# Patient Record
Sex: Female | Born: 1960 | Race: Black or African American | Hispanic: No | Marital: Married | Smoking: Never smoker
Health system: Southern US, Community
[De-identification: ages and names within clinical notes are randomized; demographics above are authoritative.]

## PROBLEM LIST (undated history)

## (undated) DIAGNOSIS — D219 Benign neoplasm of connective and other soft tissue, unspecified: Principal | ICD-10-CM

## (undated) DIAGNOSIS — IMO0002 Reserved for concepts with insufficient information to code with codable children: Secondary | ICD-10-CM

## (undated) DIAGNOSIS — F329 Major depressive disorder, single episode, unspecified: Secondary | ICD-10-CM

## (undated) DIAGNOSIS — R87619 Unspecified abnormal cytological findings in specimens from cervix uteri: Secondary | ICD-10-CM

## (undated) DIAGNOSIS — F419 Anxiety disorder, unspecified: Secondary | ICD-10-CM

## (undated) DIAGNOSIS — F32A Depression, unspecified: Secondary | ICD-10-CM

## (undated) HISTORY — DX: Benign neoplasm of connective and other soft tissue, unspecified: D21.9

## (undated) HISTORY — DX: Unspecified abnormal cytological findings in specimens from cervix uteri: R87.619

## (undated) HISTORY — DX: Reserved for concepts with insufficient information to code with codable children: IMO0002

---
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
## 1983-10-01 HISTORY — PX: OTHER SURGICAL HISTORY: SHX169

## 1991-10-01 HISTORY — PX: DIAGNOSTIC LAPAROSCOPY: SUR761

## 1999-11-27 ENCOUNTER — Other Ambulatory Visit: Admission: RE | Admit: 1999-11-27 | Discharge: 1999-11-27 | Payer: Self-pay | Admitting: Obstetrics and Gynecology

## 2003-07-07 ENCOUNTER — Other Ambulatory Visit: Admission: RE | Admit: 2003-07-07 | Discharge: 2003-07-07 | Payer: Self-pay | Admitting: Family Medicine

## 2003-09-14 ENCOUNTER — Encounter: Admission: RE | Admit: 2003-09-14 | Discharge: 2003-09-14 | Payer: Self-pay | Admitting: Otolaryngology

## 2009-04-15 ENCOUNTER — Emergency Department (HOSPITAL_COMMUNITY): Admission: EM | Admit: 2009-04-15 | Discharge: 2009-04-16 | Payer: Self-pay | Admitting: Emergency Medicine

## 2009-04-24 ENCOUNTER — Emergency Department (HOSPITAL_COMMUNITY): Admission: EM | Admit: 2009-04-24 | Discharge: 2009-04-24 | Payer: Self-pay | Admitting: Emergency Medicine

## 2010-06-22 ENCOUNTER — Ambulatory Visit (HOSPITAL_COMMUNITY): Admission: RE | Admit: 2010-06-22 | Discharge: 2010-06-22 | Payer: Self-pay | Admitting: Family Medicine

## 2010-07-03 ENCOUNTER — Encounter: Admission: RE | Admit: 2010-07-03 | Discharge: 2010-07-03 | Payer: Self-pay | Admitting: Family Medicine
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
## 2010-10-20 ENCOUNTER — Encounter: Payer: Self-pay | Admitting: Family Medicine

## 2011-01-06 LAB — BASIC METABOLIC PANEL
BUN: 7 mg/dL (ref 6–23)
CO2: 22 mEq/L (ref 19–32)
Calcium: 8.5 mg/dL (ref 8.4–10.5)
Chloride: 105 mEq/L (ref 96–112)
Creatinine, Ser: 0.73 mg/dL (ref 0.4–1.2)
GFR calc Af Amer: >60 mL/min (ref >60)
GFR calc non Af Amer: >60 mL/min (ref >60)
Glucose, Bld: 108 mg/dL — ABNORMAL HIGH (ref 70–99)
Potassium: 3.3 mEq/L — ABNORMAL LOW (ref 3.5–5.1)
Sodium: 136 mEq/L (ref 135–145)

## 2011-10-01 DIAGNOSIS — D219 Benign neoplasm of connective and other soft tissue, unspecified: Secondary | ICD-10-CM

## 2011-10-01 HISTORY — DX: Benign neoplasm of connective and other soft tissue, unspecified: D21.9

## 2011-11-24 IMAGING — US US PELVIS COMPLETE MODIFY
1 series · 14 of 25 positions shown · non-contrast
Comparison: None.

06/22/2010 - DUPLICATE COPY for exam association in [facility] – No change from original report.
CLINICAL DATA: Enlarged uterus.

 TRANSABDOMINAL ULTRASOUND OF PELVIS
TECHNIQUE: Transabdominal ultrasound examination of the pelvis was
 performed including evaluation of the uterus, ovaries, adnexal
 regions, and pelvic cul-de-sac.

[Series 1: us pelvis complete modify · 0.28mm/px · 14 of 98 slices shown]
[im 1/98]
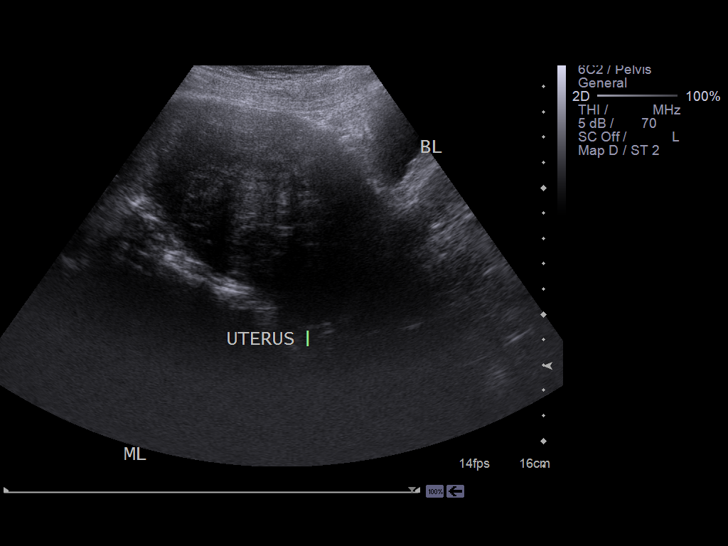
[im 9/98]
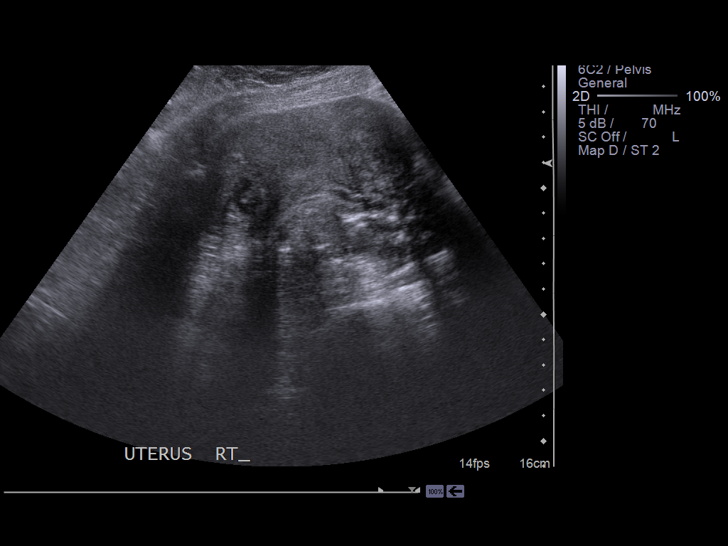
[im 17/98]
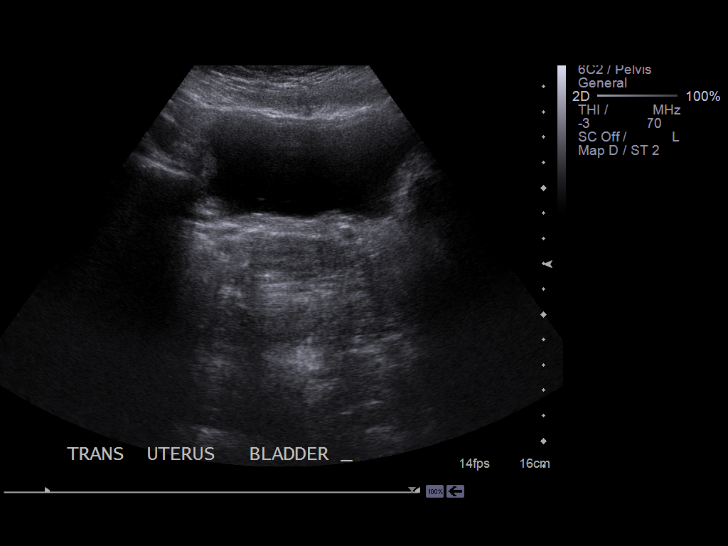
[im 25/98]
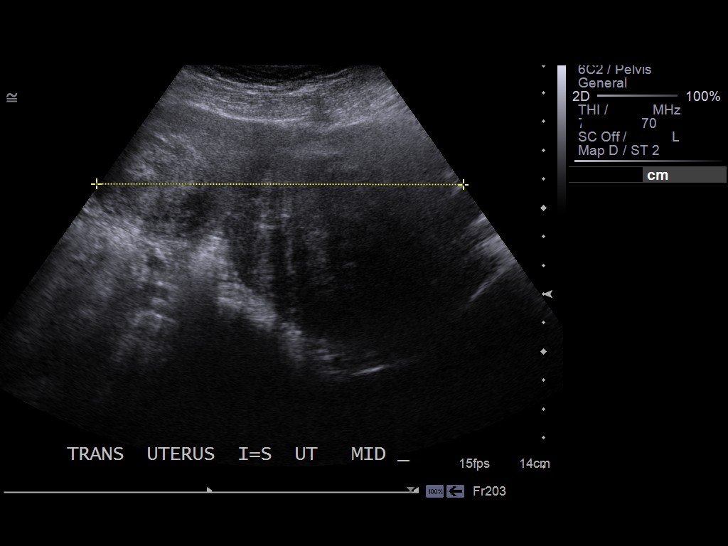
[im 33/98]
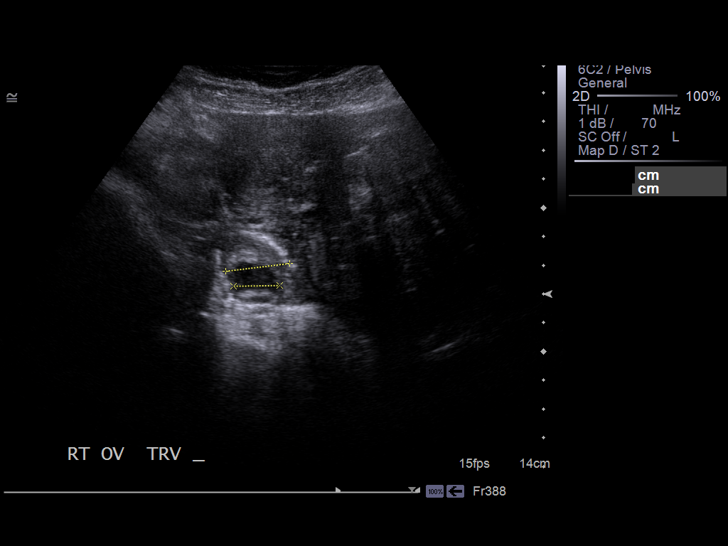
[im 37/98]
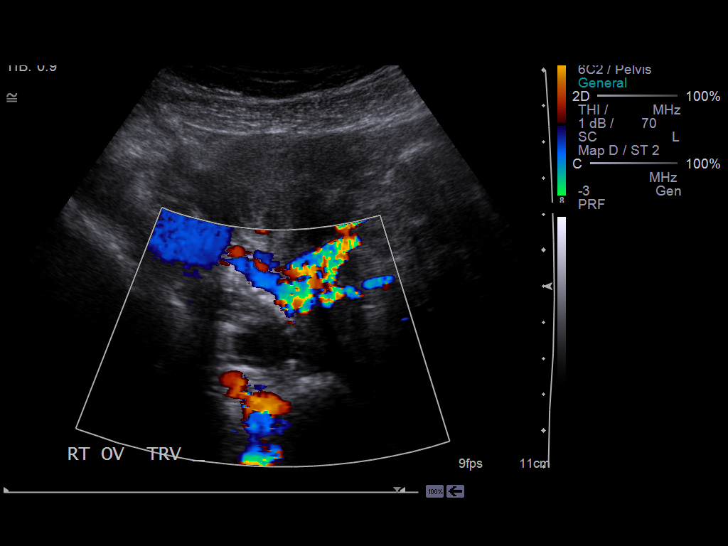
[im 45/98]
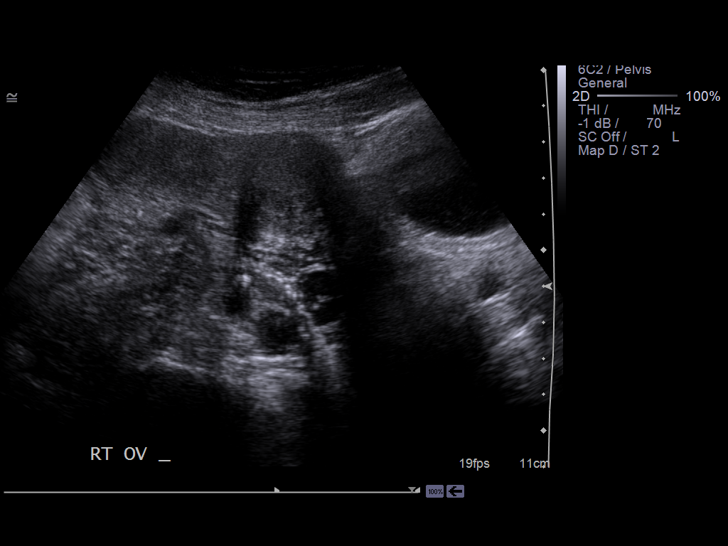
[im 53/98]
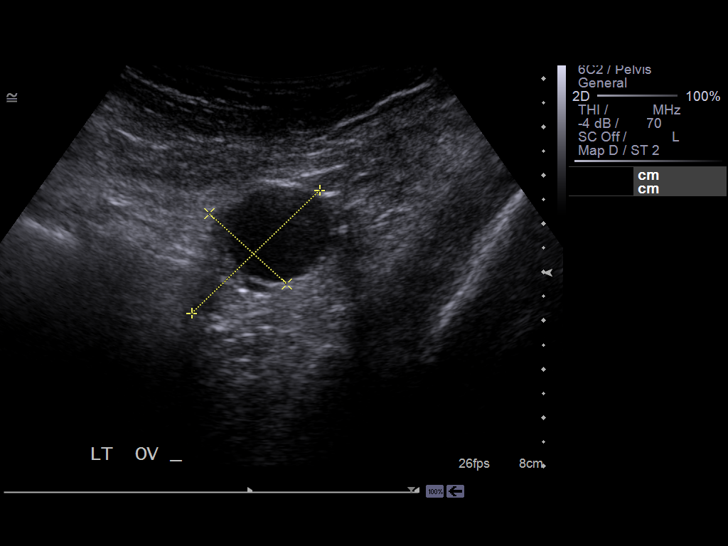
[im 61/98]
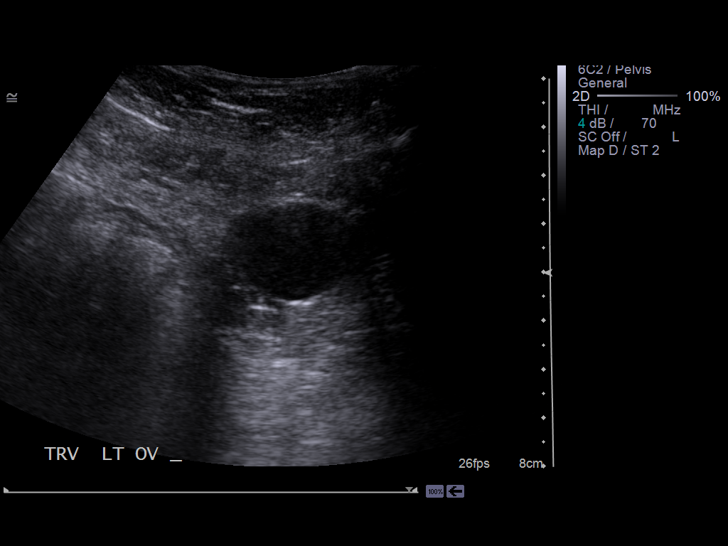
[im 65/98]
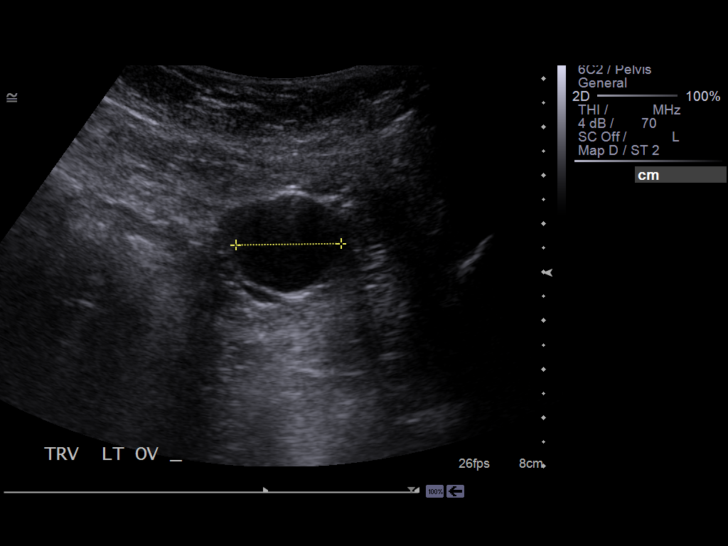
[im 73/98]
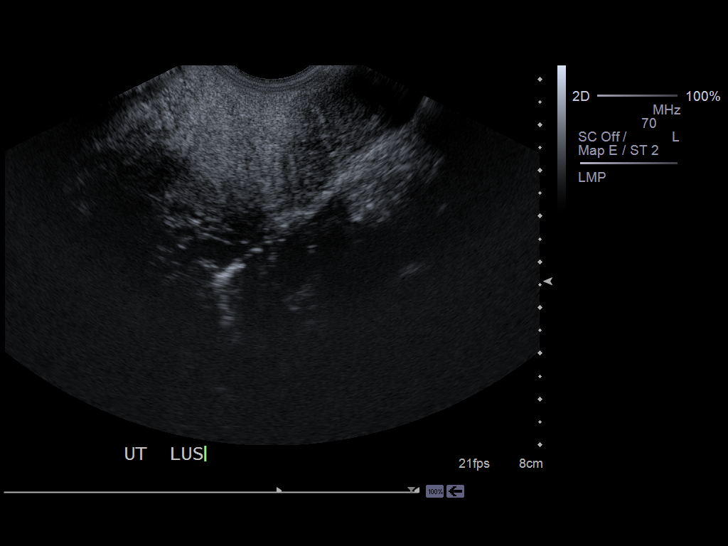
[im 81/98]
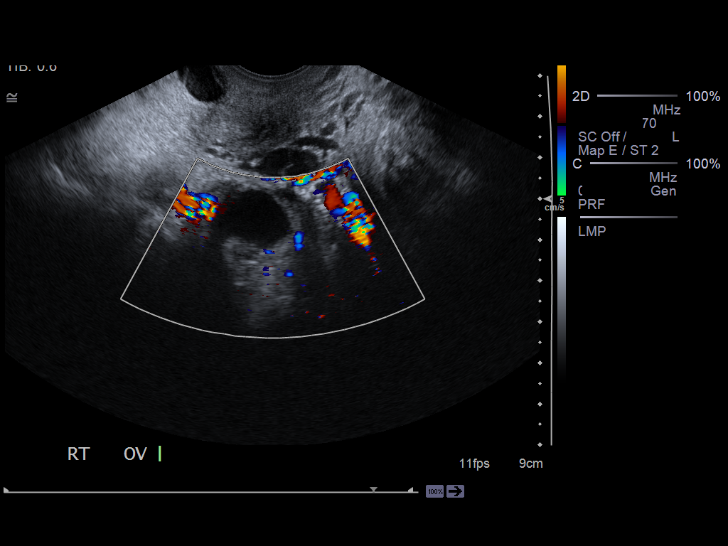
[im 89/98]
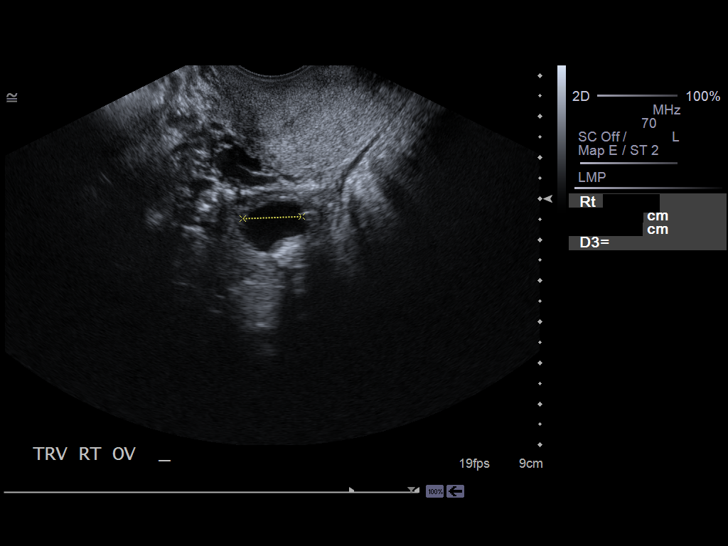
[im 98/98]
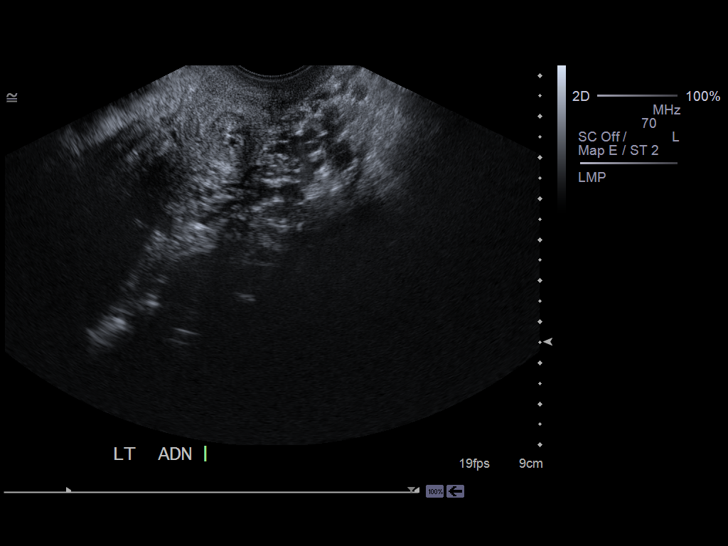

[14 of 25 positions shown; findings below may reference images not displayed]

FINDINGS: Uterus measures 14.8 x 7.0 x 12.7 cm. Large hypoechoic lesions are
 seen within, measuring up to 7.4 x 8.8 x 8.3 cm in the fundus.

 Endometrium is obscured by large fibroids.

 Right Ovary measures 2.9 x 2.1 x 2.2 cm, negative.

 Left Ovary measures 3.7 x 2.2 x 3.0 cm.

 Other Findings: No free fluid. No definite adnexal mass.
IMPRESSION: Large uterine fibroids.

## 2012-04-16 ENCOUNTER — Other Ambulatory Visit: Payer: Self-pay | Admitting: Family Medicine

## 2012-04-16 DIAGNOSIS — Z139 Encounter for screening, unspecified: Secondary | ICD-10-CM

## 2012-05-20 ENCOUNTER — Other Ambulatory Visit: Payer: Self-pay | Admitting: Family Medicine

## 2012-05-20 DIAGNOSIS — R102 Pelvic and perineal pain: Secondary | ICD-10-CM

## 2012-05-22 ENCOUNTER — Ambulatory Visit (HOSPITAL_COMMUNITY)
Admission: RE | Admit: 2012-05-22 | Discharge: 2012-05-22 | Disposition: A | Discharge status: Home / Self Care | Payer: 59 | Source: Ambulatory Visit | Attending: Family Medicine | Admitting: Family Medicine

## 2012-05-22 DIAGNOSIS — R9389 Abnormal findings on diagnostic imaging of other specified body structures: Secondary | ICD-10-CM | POA: Insufficient documentation

## 2012-05-22 DIAGNOSIS — N852 Hypertrophy of uterus: Secondary | ICD-10-CM | POA: Insufficient documentation

## 2012-05-22 DIAGNOSIS — R1031 Right lower quadrant pain: Secondary | ICD-10-CM | POA: Insufficient documentation

## 2012-05-22 DIAGNOSIS — R102 Pelvic and perineal pain: Secondary | ICD-10-CM

## 2012-05-28 ENCOUNTER — Ambulatory Visit (INDEPENDENT_AMBULATORY_CARE_PROVIDER_SITE_OTHER): Payer: 59 | Admitting: Obstetrics and Gynecology

## 2012-05-28 ENCOUNTER — Encounter: Payer: Self-pay | Admitting: Obstetrics and Gynecology

## 2012-05-28 VITALS — BP 100/60 | Ht 64.0 in | Wt 144.0 lb

## 2012-05-28 DIAGNOSIS — D259 Leiomyoma of uterus, unspecified: Secondary | ICD-10-CM

## 2012-05-28 DIAGNOSIS — Z139 Encounter for screening, unspecified: Secondary | ICD-10-CM

## 2012-05-28 DIAGNOSIS — D219 Benign neoplasm of connective and other soft tissue, unspecified: Secondary | ICD-10-CM | POA: Insufficient documentation

## 2012-05-28 DIAGNOSIS — N920 Excessive and frequent menstruation with regular cycle: Secondary | ICD-10-CM

## 2012-05-28 LAB — TSH: TSH: 2.727 u[IU]/mL (ref 0.350–4.500)

## 2012-05-28 LAB — CBC
HCT: 38.6 % (ref 36.0–46.0)
Hemoglobin: 13.1 g/dL (ref 12.0–15.0)
MCH: 27.4 pg (ref 26.0–34.0)
MCHC: 33.9 g/dL (ref 30.0–36.0)
MCV: 80.8 fL (ref 78.0–100.0)
Platelets: 278 10*3/uL (ref 150–400)
RBC: 4.78 MIL/uL (ref 3.87–5.11)
RDW: 14.2 % (ref 11.5–15.5)
WBC: 4.3 10*3/uL (ref 4.0–10.5)

## 2012-05-28 LAB — PROLACTIN: Prolactin: 14.7 ng/mL

## 2012-05-28 NOTE — Progress Notes (Signed)
H/o heavy cycles recenlty and onw which lasted 10 days.  Also c/o pain and says she is in pain right now and wants something done.  She does not have any children and is ok with that and does not desire to pursue that route any longer.  She just wants something done about fibroids.  Reports last pap was abnormal done in August.  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  S/p tx for UTI as well.  Filed Vitals:   05/28/12 0946  BP: 100/60   ROS: noncontributory  Pelvic exam:  VULVA: normal appearing vulva with no masses, tenderness or lesions,  VAGINA: normal appearing vagina with normal color and discharge, no lesions, CERVIX: normal appearing cervix without discharge or lesions,  UTERUS: uterus is 15-16wk size, shape, consistency and positive tenderness, no CMT  ADNEXA: normal adnexa in size, nontender and no masses.   Ultrasound done on May 22, 2012 secondary to right-sided pelvic pain and a history of fibroids showed the uterus to measure 15.6 x 5.1 x 9.9 cm there were multiple fibroids noted and the largest measured 8.5 cm and the next largest measured 3.1 cm the right ovary measured 3.3 cm and the left ovary measured 3.1 cm both appeared to be within normal limits  A/P ROI for last pap - pt says it was abnormal May consider trying lysteda and motrin for next cycle as pt was trying to avoid hysterectomy Sonohyst and em bx same day TSH, PRL, CBC, Vit D

## 2012-05-29 LAB — VITAMIN D 25 HYDROXY (VIT D DEFICIENCY, FRACTURES): Vit D, 25-Hydroxy: 31 ng/mL (ref 30–89)

## 2012-06-19 ENCOUNTER — Other Ambulatory Visit: Payer: Self-pay | Admitting: Obstetrics and Gynecology

## 2012-06-19 ENCOUNTER — Encounter: Payer: Self-pay | Admitting: Obstetrics and Gynecology

## 2012-06-19 ENCOUNTER — Ambulatory Visit (INDEPENDENT_AMBULATORY_CARE_PROVIDER_SITE_OTHER): Payer: 59 | Admitting: Obstetrics and Gynecology

## 2012-06-19 ENCOUNTER — Ambulatory Visit (INDEPENDENT_AMBULATORY_CARE_PROVIDER_SITE_OTHER): Payer: 59

## 2012-06-19 VITALS — BP 120/70 | Resp 16 | Ht 64.0 in | Wt 142.0 lb

## 2012-06-19 DIAGNOSIS — D259 Leiomyoma of uterus, unspecified: Secondary | ICD-10-CM

## 2012-06-19 DIAGNOSIS — D219 Benign neoplasm of connective and other soft tissue, unspecified: Secondary | ICD-10-CM

## 2012-06-19 LAB — POCT URINE PREGNANCY: Preg Test, Ur: NEGATIVE

## 2012-06-19 MED ORDER — IBUPROFEN 600 MG PO TABS: 600.0000 mg | ORAL_TABLET | Freq: Four times a day (QID) | ORAL | Status: DC | PRN

## 2012-06-19 MED ORDER — OXYCODONE HCL 5 MG PO TABS: 5.0000 mg | ORAL_TABLET | ORAL | Status: DC | PRN

## 2012-06-19 NOTE — Progress Notes (Signed)
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  Uterus alone measures 9.2 x 5.3 x 5.9 cm.  Lt ov not seen and rt ovary contains an echogenic focus 8mm.  Filed Vitals:   06/19/12 1612  BP: 120/70  Resp: 16   Reviewed findings  A/P Options and recs discussed Pt wants to proceed with hysterectomy +/- BSO (not yet clear on decision about ovaries) Sched TLH, poss LAVH, poss TAH, cystoscopy +/- BSO I informed pt that secondary to the bulkiness of her uterus and her h/o of not having any children, she may likely need a TAH.  She is ok with that and wants to proceed.

## 2012-06-23 LAB — PATHOLOGY

## 2012-06-25 ENCOUNTER — Telehealth: Payer: Self-pay | Admitting: Obstetrics and Gynecology

## 2012-06-25 ENCOUNTER — Telehealth: Payer: Self-pay

## 2012-06-25 ENCOUNTER — Other Ambulatory Visit: Payer: Self-pay | Admitting: Obstetrics and Gynecology

## 2012-06-25 NOTE — Telephone Encounter (Signed)
TLH BSO; Possible LAVH or TAH; Cystoscopy scheduled for 07/28/12 @ 1:30pm with AR/ND.  [employer] effective 06/13/12 effective 06/13/12.  Plan pays 80/20 after a $1,150 deductible.  Pre-op due $372.61. -ap

## 2012-06-25 NOTE — Telephone Encounter (Signed)
Spoke to pt to get her sched for colpo prior to surgery., per AR. Pt was also notified of EBX results wnl. Melody Comas A

## 2012-07-02 ENCOUNTER — Telehealth: Payer: Self-pay | Admitting: Obstetrics and Gynecology

## 2012-07-02 NOTE — Telephone Encounter (Signed)
Tc to pt per telephone call. Pt request rf for Ibuprofen 600mg  and Oxycodone 5mg . Pt only has a couple pills left of each med.  Pt sched for surgery on 07/28/12 for hysterectomy. Will consult with provider and cb with recs. Pt agrees.

## 2012-07-02 NOTE — Telephone Encounter (Signed)
VM from pt. Requesting RF pain med post op,  PT [id number] or [phone]

## 2012-07-03 ENCOUNTER — Telehealth: Payer: Self-pay

## 2012-07-03 ENCOUNTER — Other Ambulatory Visit: Payer: Self-pay

## 2012-07-03 MED ORDER — IBUPROFEN 600 MG PO TABS: 600.0000 mg | ORAL_TABLET | Freq: Four times a day (QID) | ORAL | Status: DC | PRN

## 2012-07-03 MED ORDER — OXYCODONE HCL 5 MG PO TABS: 5.0000 mg | ORAL_TABLET | ORAL | Status: DC | PRN

## 2012-07-03 NOTE — Telephone Encounter (Signed)
Tc to pt per request for rf for Ibuprofen and Oxycodone. Pt with surg sched 07-28-12 for hyst. Consulted with AR, pt may have both rf's. Rx for Ibuprofen e-pres to pharm on file. Pt will pick up rx for Oxycodone @ next ov on 07/07/12. Pt voices understanding.

## 2012-07-07 ENCOUNTER — Encounter: Payer: Self-pay | Admitting: Obstetrics and Gynecology

## 2012-07-07 ENCOUNTER — Ambulatory Visit (HOSPITAL_COMMUNITY): Payer: Self-pay

## 2012-07-07 ENCOUNTER — Encounter (HOSPITAL_COMMUNITY): Payer: Self-pay

## 2012-07-07 ENCOUNTER — Encounter: Payer: 59 | Admitting: Obstetrics and Gynecology

## 2012-07-07 ENCOUNTER — Encounter (HOSPITAL_COMMUNITY)
Admission: RE | Admit: 2012-07-07 | Discharge: 2012-07-07 | Disposition: A | Discharge status: Home / Self Care | Payer: 59 | Source: Ambulatory Visit | Attending: Obstetrics and Gynecology | Admitting: Obstetrics and Gynecology

## 2012-07-07 ENCOUNTER — Ambulatory Visit (INDEPENDENT_AMBULATORY_CARE_PROVIDER_SITE_OTHER): Payer: 59 | Admitting: Obstetrics and Gynecology

## 2012-07-07 ENCOUNTER — Encounter (HOSPITAL_COMMUNITY): Payer: Self-pay | Admitting: Pharmacy Technician

## 2012-07-07 VITALS — BP 100/62 | Ht 64.0 in | Wt 148.0 lb

## 2012-07-07 DIAGNOSIS — Z01812 Encounter for preprocedural laboratory examination: Secondary | ICD-10-CM | POA: Insufficient documentation

## 2012-07-07 DIAGNOSIS — IMO0002 Reserved for concepts with insufficient information to code with codable children: Secondary | ICD-10-CM | POA: Insufficient documentation

## 2012-07-07 DIAGNOSIS — R6889 Other general symptoms and signs: Secondary | ICD-10-CM

## 2012-07-07 DIAGNOSIS — Z139 Encounter for screening, unspecified: Secondary | ICD-10-CM

## 2012-07-07 DIAGNOSIS — Z01818 Encounter for other preprocedural examination: Secondary | ICD-10-CM | POA: Insufficient documentation

## 2012-07-07 DIAGNOSIS — R87811 Vaginal high risk human papillomavirus (HPV) DNA test positive: Secondary | ICD-10-CM

## 2012-07-07 HISTORY — DX: Depression, unspecified: F32.A

## 2012-07-07 HISTORY — DX: Anxiety disorder, unspecified: F41.9

## 2012-07-07 HISTORY — PX: COLPOSCOPY: SHX161

## 2012-07-07 HISTORY — DX: Major depressive disorder, single episode, unspecified: F32.9

## 2012-07-07 LAB — CBC
HCT: 36.7 % (ref 36.0–46.0)
Hemoglobin: 12.1 g/dL (ref 12.0–15.0)
MCV: 82.8 fL (ref 78.0–100.0)
Platelets: 234 10*3/uL (ref 150–400)
RBC: 4.43 MIL/uL (ref 3.87–5.11)
WBC: 6.3 10*3/uL (ref 4.0–10.5)

## 2012-07-07 LAB — POCT URINE PREGNANCY: Preg Test, Ur: NEGATIVE

## 2012-07-07 NOTE — Patient Instructions (Addendum)
20 TALEYAH HILLMAN  07/07/2012   Your procedure is scheduled on:  07/28/12  Enter through the Main Entrance of [facility] at 12 PM  Pick up the phone at the desk and dial [phone].   Call this number if you have problems the morning of surgery: [phone]   Remember:   Do not eat food:after midnight the evening prior to surgery  Do not drink clear liquids: 4 Hours before arrival.  Take these medicines the morning of surgery with A SIP OF WATER: May take Pristiq and Valium if needed   Do not wear jewelry, make-up or nail polish.  Do not wear lotions, powders, or perfumes. You may wear deodorant.  Do not shave 48 hours prior to surgery.  Do not bring valuables to the hospital.  Contacts, dentures or bridgework may not be worn into surgery.  Leave suitcase in the car. After surgery it may be brought to your room.  For patients admitted to the hospital, checkout time is 11:00 AM the day of discharge.   Patients discharged the day of surgery will not be allowed to drive home.  Name and phone number of your driver: NA  Special Instructions: Shower using CHG 2 nights before surgery and the night before surgery.  If you shower the day of surgery use CHG.  Use special wash - you have one bottle of CHG for all showers.  You should use approximately 1/3 of the bottle for each shower.   Please read over the following fact sheets that you were given: MRSA Information

## 2012-07-07 NOTE — Progress Notes (Signed)
Here for colpo ASCUS with +HPV in Sept  Filed Vitals:   07/07/12 1523  BP: 100/62   Colpo performed TZ visualized No abnl areas noted  Em Bx neg  A/P Keep preop appt  Scheduled for surgery later this month

## 2012-07-08 ENCOUNTER — Encounter: Payer: Self-pay | Admitting: Obstetrics and Gynecology

## 2012-07-08 ENCOUNTER — Ambulatory Visit (INDEPENDENT_AMBULATORY_CARE_PROVIDER_SITE_OTHER): Payer: 59 | Admitting: Obstetrics and Gynecology

## 2012-07-08 VITALS — BP 102/60 | HR 62 | Temp 98.2°F | Resp 14 | Ht 64.0 in | Wt 151.0 lb

## 2012-07-08 DIAGNOSIS — Z01818 Encounter for other preprocedural examination: Secondary | ICD-10-CM

## 2012-07-08 NOTE — Progress Notes (Signed)
Terri Rivera is a 51 y.o. female G1P0010 who presents for hysterectomy because of symptomatic uterine fibroids. Over the past five months, patient has had heavy bleeding with one episode that lasted for ten days requiring a super pad change 3 times a day. Normally she will only bleed for 3 days and change her pad once a day. Additionally, with the heavier bleeding, she will experience cramping rated at 10/10 on a 10 point pain scale that will only decrease to 9/10 with Ibuprofen 600 mg. A pelvic ultrasound with sonohysterogram in Septiember revealed: uterus-10.4 x 10.1 x 7.44 cm with diffuse fibroid enlargement: left lateral subserosal-8.6 x 6.3 x 7.9 cm; anterior midline-2.9 x 2.7 x 2.7 cm; midline fundal-3.3 x 2.9 x 2.7 cm and several lower uterine segment fibroids that appear to have a submucosal component; left ovary appears normal and right ovary has an echogenic 8 mm focus. An endometrial biopsy done at the same time returned normal-no hyperplasia, atypia or malignancy. Lab results were also normal (i.e. TSH, Prolactin and CBC). Patient admits to urinary urgency/hesitancy/frequency and constipation, but denies intermenstrual bleeding, vaginitis symptoms or dyspareunia.   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 ; Last PAP smear: July 2013 showed Atypical Cells of Undetermined Significance and positive high risk HPV but subsequent colposcopy did not reveal any abnormalities.   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 B Complex-Folic Acid (B COMPLEX PLUS PO) Take 1,000 mcg by mouth every morning.  . desvenlafaxine (PRISTIQ) 50 MG 24 hr tablet Take 50 mg by mouth daily.  . diazepam (VALIUM) 10 MG tablet Take 10 mg by mouth every 8 (eight) hours as needed. For anxiety  . ibuprofen (ADVIL,MOTRIN) 600 MG tablet Take 1 tablet (600 mg total) by mouth every 6 (six) hours as needed for pain. 30 tablet 0  . oxyCODONE (OXY IR/ROXICODONE) 5 MG immediate release tablet Take 1 tablet (5 mg total) by mouth every 4 (four) hours as needed for pain. 30 tablet 0  . zolpidem (AMBIEN) 10 MG tablet Take 10 mg by mouth at bedtime as needed. For sleep, only takes while working   Allergies  Allergen Reactions  . Codeine Hives  . Tylenol (Acetaminophen) Itching   Denies sensitivity to peanuts, shellfish, soy, latex or adhesives.   ROS: Admits to partial upper dental plate but denies headache, vision changes, nasal congestion, dysphagia, tinnitus, dizziness, hoarseness, cough, chest pain, shortness of breath, nausea, vomiting, diarrhea, dysuria, hematuria, vaginitis symptoms, pelvic pain, swelling of joints,easy bruising, myalgias, arthralgias, skin rashes, unexplained weight loss and except as is mentioned in the history of present illness, patient's review of systems is otherwise negative.    Physical Exam  BP 102/60  Pulse 62  Temp 98.2 F (36.8 C) (Oral)  Resp 14  Ht 5' 4" (1.626 m)  Wt 151 lb (68.493 kg)  BMI 25.92 kg/m2  LMP 06/16/2012  Neck: supple without masses or thyromegaly  Lungs: clear to auscultation  Heart: regular rate and rhythm  Abdomen: soft, non-tender and   no organomegaly  Pelvic:EGBUS- wnl; vagina-normal rugae; uterus-irregular, tender, 14-16 weeks size; cervix without lesions or motion tenderness; adnexae-no tenderness or separable masses though obliterated by the mass effect of the uterus  Extremities: no clubbing, cyanosis  or edema    Assesment: Symptomatic Uterine Fibroids                       Menorrhagia                       Pelvic Pain    Disposition: A discussion was held with patient regarding the indication for her procedure(s) along with the risks, which include but are not limited to: reaction to anesthesia, damage to adjacent organs, infection, pelvic prolapse and excessive bleeding. Patient verbalized understanding of these risks and has consented to proceed with a Total Laparoscopic Hysterectomy with possible, Laparoscopically Assisted Vaginal Hysterectomy, possible Total Abdominal Hysterectomy with Bilateral Salpingo-oophorectomy and possible Cystoscopy at [facility], July 28, 2012 at 1:30 p.m.   CSN# [id number]  Ellard Nan J. Sky Borboa, PA-C for Dr. Angela Y. Roberts  

## 2012-07-09 ENCOUNTER — Ambulatory Visit (HOSPITAL_COMMUNITY): Payer: 59

## 2012-07-09 ENCOUNTER — Other Ambulatory Visit (HOSPITAL_COMMUNITY): Payer: 59

## 2012-07-13 NOTE — H&P (Signed)
Terri Rivera is a 50 y.o. female G1P0010 who presents for hysterectomy because of symptomatic uterine fibroids. Over the past five months, patient has had heavy bleeding with one episode that lasted for ten days requiring a super pad change 3 times a day. Normally she will only bleed for 3 days and change her pad once a day. Additionally, with the heavier bleeding, she will experience cramping rated at 10/10 on a 10 point pain scale that will only decrease to 9/10 with Ibuprofen 600 mg. A pelvic ultrasound with sonohysterogram in Septiember revealed: uterus-10.4 x 10.1 x 7.44 cm with diffuse fibroid enlargement: left lateral subserosal-8.6 x 6.3 x 7.9 cm; anterior midline-2.9 x 2.7 x 2.7 cm; midline fundal-3.3 x 2.9 x 2.7 cm and several lower uterine segment fibroids that appear to have a submucosal component; left ovary appears normal and right ovary has an echogenic 8 mm focus. An endometrial biopsy done at the same time returned normal-no hyperplasia, atypia or malignancy. Lab results were also normal (i.e. TSH, Prolactin and CBC). Patient admits to urinary urgency/hesitancy/frequency and constipation, but denies intermenstrual bleeding, vaginitis symptoms or dyspareunia.   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 ; Last PAP smear: July 2013 showed Atypical Cells of Undetermined Significance and positive high risk HPV but subsequent colposcopy did not reveal any abnormalities.   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 B Complex-Folic Acid (B COMPLEX PLUS PO) Take 1,000 mcg by mouth every morning.  . desvenlafaxine (PRISTIQ) 50 MG 24 hr tablet Take 50 mg by mouth daily.  . diazepam (VALIUM) 10 MG tablet Take 10 mg by mouth every 8 (eight) hours as needed. For anxiety  . ibuprofen (ADVIL,MOTRIN) 600 MG tablet Take 1 tablet (600 mg total) by mouth every 6 (six) hours as needed for pain. 30 tablet 0  . oxyCODONE (OXY IR/ROXICODONE) 5 MG immediate release tablet Take 1 tablet (5 mg total) by mouth every 4 (four) hours as needed for pain. 30 tablet 0  . zolpidem (AMBIEN) 10 MG tablet Take 10 mg by mouth at bedtime as needed. For sleep, only takes while working   Allergies  Allergen Reactions  . Codeine Hives  . Tylenol (Acetaminophen) Itching   Denies sensitivity to peanuts, shellfish, soy, latex or adhesives.   ROS: Admits to partial upper dental plate but denies headache, vision changes, nasal congestion, dysphagia, tinnitus, dizziness, hoarseness, cough, chest pain, shortness of breath, nausea, vomiting, diarrhea, dysuria, hematuria, vaginitis symptoms, pelvic pain, swelling of joints,easy bruising, myalgias, arthralgias, skin rashes, unexplained weight loss and except as is mentioned in the history of present illness, patient's review of systems is otherwise negative.    Physical Exam  BP 102/60  Pulse 62  Temp 98.2 F (36.8 C) (Oral)  Resp 14  Ht 5' 4" (1.626 m)  Wt 151 lb (68.493 kg)  BMI 25.92 kg/m2  LMP 06/16/2012  Neck: supple without masses or thyromegaly  Lungs: clear to auscultation  Heart: regular rate and rhythm  Abdomen: soft, non-tender and   no organomegaly  Pelvic:EGBUS- wnl; vagina-normal rugae; uterus-irregular, tender, 14-16 weeks size; cervix without lesions or motion tenderness; adnexae-no tenderness or separable masses though obliterated by the mass effect of the uterus  Extremities: no clubbing, cyanosis  or edema    Assesment: Symptomatic Uterine Fibroids                       Menorrhagia                       Pelvic Pain    Disposition: A discussion was held with patient regarding the indication for her procedure(s) along with the risks, which include but are not limited to: reaction to anesthesia, damage to adjacent organs, infection, pelvic prolapse and excessive bleeding. Patient verbalized understanding of these risks and has consented to proceed with a Total Laparoscopic Hysterectomy with possible, Laparoscopically Assisted Vaginal Hysterectomy, possible Total Abdominal Hysterectomy with Bilateral Salpingo-oophorectomy and possible Cystoscopy at [facility], July 28, 2012 at 1:30 p.m.   CSN# [id number]  Trayonna Bachmeier J. Kaye Luoma, PA-C for Dr. Angela Y. Roberts  

## 2012-07-14 ENCOUNTER — Inpatient Hospital Stay (HOSPITAL_COMMUNITY): Admission: RE | Admit: 2012-07-14 | Payer: 59 | Source: Ambulatory Visit

## 2012-07-15 ENCOUNTER — Encounter: Payer: 59 | Admitting: Obstetrics and Gynecology

## 2012-07-27 MED ORDER — DEXTROSE 5 % IV SOLN
2.0000 g | INTRAVENOUS | Status: AC
  Administered 2012-07-28 (×2): 2 g via INTRAVENOUS
  Filled 2012-07-27: qty 2

## 2012-07-28 ENCOUNTER — Inpatient Hospital Stay (HOSPITAL_COMMUNITY)
Admission: RE | Admit: 2012-07-28 | Discharge: 2012-07-30 | DRG: 743 | Disposition: A | Discharge status: Home / Self Care | Payer: 59 | Source: Ambulatory Visit | Attending: Obstetrics and Gynecology | Admitting: Obstetrics and Gynecology

## 2012-07-28 ENCOUNTER — Encounter (HOSPITAL_COMMUNITY)
Admission: RE | Disposition: A | Discharge status: Home / Self Care | Payer: Self-pay | Source: Ambulatory Visit | Attending: Obstetrics and Gynecology

## 2012-07-28 ENCOUNTER — Encounter (HOSPITAL_COMMUNITY): Payer: Self-pay | Admitting: Anesthesiology

## 2012-07-28 ENCOUNTER — Ambulatory Visit (HOSPITAL_COMMUNITY): Payer: 59 | Admitting: Anesthesiology

## 2012-07-28 DIAGNOSIS — N8000 Endometriosis of the uterus, unspecified: Secondary | ICD-10-CM | POA: Diagnosis present

## 2012-07-28 DIAGNOSIS — Z5331 Laparoscopic surgical procedure converted to open procedure: Secondary | ICD-10-CM

## 2012-07-28 DIAGNOSIS — N831 Corpus luteum cyst of ovary, unspecified side: Secondary | ICD-10-CM | POA: Diagnosis present

## 2012-07-28 DIAGNOSIS — D251 Intramural leiomyoma of uterus: Secondary | ICD-10-CM | POA: Diagnosis present

## 2012-07-28 DIAGNOSIS — N920 Excessive and frequent menstruation with regular cycle: Secondary | ICD-10-CM | POA: Diagnosis present

## 2012-07-28 DIAGNOSIS — D250 Submucous leiomyoma of uterus: Principal | ICD-10-CM | POA: Diagnosis present

## 2012-07-28 DIAGNOSIS — N949 Unspecified condition associated with female genital organs and menstrual cycle: Secondary | ICD-10-CM | POA: Diagnosis present

## 2012-07-28 DIAGNOSIS — D649 Anemia, unspecified: Secondary | ICD-10-CM | POA: Diagnosis present

## 2012-07-28 DIAGNOSIS — D259 Leiomyoma of uterus, unspecified: Secondary | ICD-10-CM

## 2012-07-28 DIAGNOSIS — Z9889 Other specified postprocedural states: Secondary | ICD-10-CM

## 2012-07-28 DIAGNOSIS — D279 Benign neoplasm of unspecified ovary: Secondary | ICD-10-CM | POA: Diagnosis present

## 2012-07-28 DIAGNOSIS — D252 Subserosal leiomyoma of uterus: Secondary | ICD-10-CM | POA: Diagnosis present

## 2012-07-28 HISTORY — PX: ABDOMINAL HYSTERECTOMY: SHX81

## 2012-07-28 HISTORY — PX: SALPINGOOPHORECTOMY: SHX82

## 2012-07-28 HISTORY — PX: LAPAROSCOPY: SHX197

## 2012-07-28 HISTORY — PX: CYSTOSCOPY: SHX5120

## 2012-07-28 LAB — HCG, SERUM, QUALITATIVE: Preg, Serum: NEGATIVE

## 2012-07-28 SURGERY — HYSTERECTOMY, ABDOMINAL
Anesthesia: General | Site: Abdomen | Wound class: Clean Contaminated

## 2012-07-28 MED ORDER — ONDANSETRON HCL 4 MG/2ML IJ SOLN: 4.0000 mg | Freq: Four times a day (QID) | INTRAMUSCULAR | Status: DC | PRN

## 2012-07-28 MED ORDER — FENTANYL CITRATE 0.05 MG/ML IJ SOLN
INTRAMUSCULAR | Status: DC | PRN
  Administered 2012-07-28: 100 ug via INTRAVENOUS
  Administered 2012-07-28: 50 ug via INTRAVENOUS
  Administered 2012-07-28 (×2): 100 ug via INTRAVENOUS
  Administered 2012-07-28: 50 ug via INTRAVENOUS
  Administered 2012-07-28: 100 ug via INTRAVENOUS

## 2012-07-28 MED ORDER — KETOROLAC TROMETHAMINE 30 MG/ML IJ SOLN
30.0000 mg | Freq: Four times a day (QID) | INTRAMUSCULAR | Status: DC
  Administered 2012-07-28: 30 mg via INTRAVENOUS

## 2012-07-28 MED ORDER — DEXAMETHASONE SODIUM PHOSPHATE 10 MG/ML IJ SOLN
INTRAMUSCULAR | Status: DC | PRN
  Administered 2012-07-28: 10 mg via INTRAVENOUS

## 2012-07-28 MED ORDER — OXYCODONE-ACETAMINOPHEN 5-325 MG PO TABS: 1.0000 | ORAL_TABLET | ORAL | Status: DC | PRN

## 2012-07-28 MED ORDER — HYDROMORPHONE HCL PF 1 MG/ML IJ SOLN
INTRAMUSCULAR | Status: AC
  Administered 2012-07-28: 0.5 mg via INTRAVENOUS
  Filled 2012-07-28: qty 1

## 2012-07-28 MED ORDER — ESTRADIOL 0.1 MG/GM VA CREA
TOPICAL_CREAM | VAGINAL | Status: AC
  Filled 2012-07-28: qty 42.5

## 2012-07-28 MED ORDER — GLYCOPYRROLATE 0.2 MG/ML IJ SOLN
INTRAMUSCULAR | Status: DC | PRN
  Administered 2012-07-28: 0.1 mg via INTRAVENOUS
  Administered 2012-07-28: 0.6 mg via INTRAVENOUS
  Administered 2012-07-28: 0.1 mg via INTRAVENOUS
  Administered 2012-07-28: 0.4 mg via INTRAVENOUS

## 2012-07-28 MED ORDER — METOCLOPRAMIDE HCL 5 MG/ML IJ SOLN
10.0000 mg | Freq: Three times a day (TID) | INTRAMUSCULAR | Status: AC
  Administered 2012-07-29 (×2): 10 mg via INTRAVENOUS
  Filled 2012-07-28 (×3): qty 2

## 2012-07-28 MED ORDER — DIPHENHYDRAMINE HCL 50 MG/ML IJ SOLN: 12.5000 mg | Freq: Three times a day (TID) | INTRAMUSCULAR | Status: DC | PRN

## 2012-07-28 MED ORDER — DEXTROSE 5 % IV SOLN: 2.0000 g | Freq: Two times a day (BID) | INTRAVENOUS | Status: DC

## 2012-07-28 MED ORDER — ROCURONIUM BROMIDE 50 MG/5ML IV SOLN
INTRAVENOUS | Status: AC
  Filled 2012-07-28: qty 1

## 2012-07-28 MED ORDER — NALOXONE HCL 0.4 MG/ML IJ SOLN
INTRAMUSCULAR | Status: AC
  Filled 2012-07-28: qty 1

## 2012-07-28 MED ORDER — KETOROLAC TROMETHAMINE 30 MG/ML IJ SOLN
INTRAMUSCULAR | Status: AC
  Administered 2012-07-28: 30 mg via INTRAVENOUS
  Filled 2012-07-28: qty 1

## 2012-07-28 MED ORDER — HYDROMORPHONE HCL PF 1 MG/ML IJ SOLN
INTRAMUSCULAR | Status: DC | PRN
  Administered 2012-07-28: 1 mg via INTRAVENOUS

## 2012-07-28 MED ORDER — INDIGOTINDISULFONATE SODIUM 8 MG/ML IJ SOLN
INTRAMUSCULAR | Status: DC | PRN
  Administered 2012-07-28: 5 mL via INTRAVENOUS

## 2012-07-28 MED ORDER — DEXAMETHASONE SODIUM PHOSPHATE 10 MG/ML IJ SOLN
INTRAMUSCULAR | Status: AC
  Filled 2012-07-28: qty 1

## 2012-07-28 MED ORDER — KETOROLAC TROMETHAMINE 30 MG/ML IJ SOLN
30.0000 mg | Freq: Four times a day (QID) | INTRAMUSCULAR | Status: AC
  Administered 2012-07-29: 30 mg via INTRAVENOUS
  Filled 2012-07-28 (×2): qty 1

## 2012-07-28 MED ORDER — GLYCOPYRROLATE 0.2 MG/ML IJ SOLN
INTRAMUSCULAR | Status: AC
  Filled 2012-07-28: qty 3

## 2012-07-28 MED ORDER — LACTATED RINGERS IV SOLN
INTRAVENOUS | Status: DC
  Administered 2012-07-29: 02:00:00 via INTRAVENOUS

## 2012-07-28 MED ORDER — NEOSTIGMINE METHYLSULFATE 1 MG/ML IJ SOLN
INTRAMUSCULAR | Status: AC
  Filled 2012-07-28: qty 10

## 2012-07-28 MED ORDER — MIDAZOLAM HCL 2 MG/2ML IJ SOLN
INTRAMUSCULAR | Status: AC
  Filled 2012-07-28: qty 2

## 2012-07-28 MED ORDER — VASOPRESSIN 20 UNIT/ML IJ SOLN
INTRAMUSCULAR | Status: AC
  Filled 2012-07-28: qty 1

## 2012-07-28 MED ORDER — BUPIVACAINE HCL (PF) 0.25 % IJ SOLN
INTRAMUSCULAR | Status: DC | PRN
  Administered 2012-07-28: 7 mL

## 2012-07-28 MED ORDER — BUPIVACAINE HCL (PF) 0.25 % IJ SOLN
INTRAMUSCULAR | Status: AC
  Filled 2012-07-28: qty 30

## 2012-07-28 MED ORDER — DIPHENHYDRAMINE HCL 12.5 MG/5ML PO ELIX
12.5000 mg | ORAL_SOLUTION | Freq: Four times a day (QID) | ORAL | Status: DC | PRN
  Administered 2012-07-30: 12.5 mg via ORAL
  Filled 2012-07-28: qty 5

## 2012-07-28 MED ORDER — DEXTROSE 5 % IV SOLN
2.0000 g | Freq: Once | INTRAVENOUS | Status: DC
  Filled 2012-07-28: qty 2

## 2012-07-28 MED ORDER — HYDROMORPHONE 0.3 MG/ML IV SOLN: INTRAVENOUS | Status: DC

## 2012-07-28 MED ORDER — SODIUM CHLORIDE 0.9 % IJ SOLN: 9.0000 mL | INTRAMUSCULAR | Status: DC | PRN

## 2012-07-28 MED ORDER — LIDOCAINE HCL (CARDIAC) 20 MG/ML IV SOLN
INTRAVENOUS | Status: DC | PRN
  Administered 2012-07-28: 60 mg via INTRAVENOUS

## 2012-07-28 MED ORDER — DIPHENHYDRAMINE HCL 50 MG/ML IJ SOLN: 12.5000 mg | Freq: Four times a day (QID) | INTRAMUSCULAR | Status: DC | PRN

## 2012-07-28 MED ORDER — IBUPROFEN 600 MG PO TABS
600.0000 mg | ORAL_TABLET | Freq: Four times a day (QID) | ORAL | Status: DC | PRN
  Administered 2012-07-29 – 2012-07-30 (×4): 600 mg via ORAL
  Filled 2012-07-28 (×4): qty 1

## 2012-07-28 MED ORDER — STERILE WATER FOR IRRIGATION IR SOLN
Status: DC | PRN
  Administered 2012-07-28: 1000 mL

## 2012-07-28 MED ORDER — VASOPRESSIN 20 UNIT/ML IJ SOLN
INTRAVENOUS | Status: DC | PRN
  Administered 2012-07-28: 16:00:00 via INTRAMUSCULAR

## 2012-07-28 MED ORDER — NALOXONE HCL 0.4 MG/ML IJ SOLN
0.4000 mg | INTRAMUSCULAR | Status: DC | PRN
  Administered 2012-07-28: 0.4 mg via INTRAVENOUS

## 2012-07-28 MED ORDER — ROCURONIUM BROMIDE 100 MG/10ML IV SOLN
INTRAVENOUS | Status: DC | PRN
  Administered 2012-07-28: 10 mg via INTRAVENOUS
  Administered 2012-07-28: 25 mg via INTRAVENOUS
  Administered 2012-07-28: 20 mg via INTRAVENOUS
  Administered 2012-07-28: 5 mg via INTRAVENOUS
  Administered 2012-07-28: 20 mg via INTRAVENOUS

## 2012-07-28 MED ORDER — HYDROMORPHONE HCL PF 1 MG/ML IJ SOLN
INTRAMUSCULAR | Status: AC
  Filled 2012-07-28: qty 1

## 2012-07-28 MED ORDER — FENTANYL CITRATE 0.05 MG/ML IJ SOLN
INTRAMUSCULAR | Status: AC
  Filled 2012-07-28: qty 5

## 2012-07-28 MED ORDER — NEOSTIGMINE METHYLSULFATE 1 MG/ML IJ SOLN
INTRAMUSCULAR | Status: DC | PRN
  Administered 2012-07-28: 3 mg via INTRAVENOUS
  Administered 2012-07-28: 2 mg via INTRAVENOUS

## 2012-07-28 MED ORDER — PROPOFOL 10 MG/ML IV BOLUS
INTRAVENOUS | Status: DC | PRN
  Administered 2012-07-28: 160 mg via INTRAVENOUS

## 2012-07-28 MED ORDER — PROPOFOL 10 MG/ML IV EMUL
INTRAVENOUS | Status: AC
  Filled 2012-07-28: qty 20

## 2012-07-28 MED ORDER — INDIGOTINDISULFONATE SODIUM 8 MG/ML IJ SOLN
INTRAMUSCULAR | Status: AC
  Filled 2012-07-28: qty 5

## 2012-07-28 MED ORDER — MIDAZOLAM HCL 5 MG/5ML IJ SOLN
INTRAMUSCULAR | Status: DC | PRN
  Administered 2012-07-28: 2 mg via INTRAVENOUS

## 2012-07-28 MED ORDER — HYDROMORPHONE HCL 2 MG PO TABS
2.0000 mg | ORAL_TABLET | ORAL | Status: DC | PRN
  Administered 2012-07-28 – 2012-07-29 (×4): 2 mg via ORAL
  Filled 2012-07-28 (×4): qty 1

## 2012-07-28 MED ORDER — ONDANSETRON HCL 4 MG/2ML IJ SOLN
INTRAMUSCULAR | Status: DC | PRN
  Administered 2012-07-28: 4 mg via INTRAVENOUS

## 2012-07-28 MED ORDER — HYDROMORPHONE 0.3 MG/ML IV SOLN
INTRAVENOUS | Status: DC
  Filled 2012-07-28: qty 25

## 2012-07-28 MED ORDER — HYDROMORPHONE HCL PF 1 MG/ML IJ SOLN
0.2500 mg | INTRAMUSCULAR | Status: DC | PRN
  Administered 2012-07-28 (×4): 0.5 mg via INTRAVENOUS

## 2012-07-28 MED ORDER — LACTATED RINGERS IV SOLN
INTRAVENOUS | Status: DC | PRN
  Administered 2012-07-28 (×3): via INTRAVENOUS
  Administered 2012-07-28: 1000 mL
  Administered 2012-07-28: 14:00:00 via INTRAVENOUS

## 2012-07-28 MED ORDER — LIDOCAINE HCL (CARDIAC) 20 MG/ML IV SOLN
INTRAVENOUS | Status: AC
  Filled 2012-07-28: qty 5

## 2012-07-28 MED ORDER — 0.9 % SODIUM CHLORIDE (POUR BTL) OPTIME
TOPICAL | Status: DC | PRN
  Administered 2012-07-28: 1000 mL

## 2012-07-28 MED ORDER — ONDANSETRON HCL 4 MG/2ML IJ SOLN
INTRAMUSCULAR | Status: AC
  Filled 2012-07-28: qty 2

## 2012-07-28 SURGICAL SUPPLY — 91 items
BARRIER ADHS 3X4 INTERCEED (GAUZE/BANDAGES/DRESSINGS) IMPLANT
CABLE HIGH FREQUENCY MONO STRZ (ELECTRODE) IMPLANT
CANISTER SUCTION 2500CC (MISCELLANEOUS) ×4 IMPLANT
CATH ROBINSON RED A/P 16FR (CATHETERS) IMPLANT
CHLORAPREP W/TINT 26ML (MISCELLANEOUS) ×4 IMPLANT
CLEANER TIP ELECTROSURG 2X2 (MISCELLANEOUS) ×4 IMPLANT
CLIP LIGATING EXTRA MED SLVR (CLIP) ×4 IMPLANT
CLOTH BEACON ORANGE TIMEOUT ST (SAFETY) ×4 IMPLANT
CONT PATH 16OZ SNAP LID 3702 (MISCELLANEOUS) ×4 IMPLANT
COVER LIGHT HANDLE  1/PK (MISCELLANEOUS) ×1
COVER LIGHT HANDLE 1/PK (MISCELLANEOUS) ×3 IMPLANT
COVER MAYO STAND STRL (DRAPES) ×4 IMPLANT
COVER TABLE BACK 60X90 (DRAPES) ×4 IMPLANT
DECANTER SPIKE VIAL GLASS SM (MISCELLANEOUS) ×4 IMPLANT
DERMABOND ADVANCED (GAUZE/BANDAGES/DRESSINGS) ×1
DERMABOND ADVANCED .7 DNX12 (GAUZE/BANDAGES/DRESSINGS) ×3 IMPLANT
DISSECTOR BLUNT TIP ENDO 5MM (MISCELLANEOUS) IMPLANT
DISSECTOR SPONGE CHERRY (GAUZE/BANDAGES/DRESSINGS) IMPLANT
DRAIN JACKSON PRT FLT 7MM (DRAIN) ×4 IMPLANT
DRAPE HYSTEROSCOPY (DRAPE) ×4 IMPLANT
DRAPE PROXIMA HALF (DRAPES) ×4 IMPLANT
ELECT REM PT RETURN 9FT ADLT (ELECTROSURGICAL) ×4
ELECTRODE REM PT RTRN 9FT ADLT (ELECTROSURGICAL) ×3 IMPLANT
EVACUATOR SILICONE 100CC (DRAIN) ×4 IMPLANT
EVACUATOR SMOKE 8.L (FILTER) ×8 IMPLANT
FORCEPS CUTTING 33CM 5MM (CUTTING FORCEPS) IMPLANT
GAUZE PACKING 2X5 YD STERILE (GAUZE/BANDAGES/DRESSINGS) IMPLANT
GAUZE SPONGE 4X4 16PLY XRAY LF (GAUZE/BANDAGES/DRESSINGS) ×8 IMPLANT
GLOVE BIO SURGEON STRL SZ7.5 (GLOVE) ×24 IMPLANT
GLOVE BIOGEL PI IND STRL 7.5 (GLOVE) ×6 IMPLANT
GLOVE BIOGEL PI INDICATOR 7.5 (GLOVE) ×2
GOWN PREVENTION PLUS LG XLONG (DISPOSABLE) ×12 IMPLANT
HEMOSTAT SURGICEL 2X14 (HEMOSTASIS) IMPLANT
HEMOSTAT SURGICEL 2X3 (HEMOSTASIS) ×8 IMPLANT
HEMOSTAT SURGICEL 4X8 (HEMOSTASIS) ×4 IMPLANT
NEEDLE HYPO 25X1 1.5 SAFETY (NEEDLE) IMPLANT
NEEDLE INSUFFLATION 14GA 120MM (NEEDLE) ×4 IMPLANT
NEEDLE MAYO .5 CIRCLE (NEEDLE) IMPLANT
NS IRRIG 1000ML POUR BTL (IV SOLUTION) IMPLANT
OCCLUDER COLPOPNEUMO (BALLOONS) ×4 IMPLANT
PACK LAPAROSCOPY BASIN (CUSTOM PROCEDURE TRAY) ×4 IMPLANT
PACK LAVH (CUSTOM PROCEDURE TRAY) IMPLANT
PAD OB MATERNITY 4.3X12.25 (PERSONAL CARE ITEMS) ×4 IMPLANT
PENCIL BUTTON HOLSTER BLD 10FT (ELECTRODE) ×4 IMPLANT
PROTECTOR NERVE ULNAR (MISCELLANEOUS) ×12 IMPLANT
SCALPEL HARMONIC ACE (MISCELLANEOUS) IMPLANT
SCISSORS LAP 5X35 DISP (ENDOMECHANICALS) IMPLANT
SET CYSTO W/LG BORE CLAMP LF (SET/KITS/TRAYS/PACK) ×4 IMPLANT
SET IRRIG TUBING LAPAROSCOPIC (IRRIGATION / IRRIGATOR) ×4 IMPLANT
SLEEVE Z-THREAD 5X100MM (TROCAR) ×8 IMPLANT
SOLUTION ELECTROLUBE (MISCELLANEOUS) IMPLANT
SPONGE LAP 18X18 X RAY DECT (DISPOSABLE) ×8 IMPLANT
STAPLER VISISTAT 35W (STAPLE) IMPLANT
STRIP CLOSURE SKIN 1/4X3 (GAUZE/BANDAGES/DRESSINGS) IMPLANT
STRIP CLOSURE SKIN 1/4X4 (GAUZE/BANDAGES/DRESSINGS) IMPLANT
SUT CHROMIC 2 0 CT 1 (SUTURE) ×4 IMPLANT
SUT CHROMIC 2 0 SH (SUTURE) IMPLANT
SUT CHROMIC 2 0 TIES 18 (SUTURE) IMPLANT
SUT MNCRL AB 3-0 PS2 27 (SUTURE) ×12 IMPLANT
SUT MON AB 3-0 SH 27 (SUTURE)
SUT MON AB 3-0 SH27 (SUTURE) IMPLANT
SUT MON AB 4-0 PS1 27 (SUTURE) ×4 IMPLANT
SUT MONOCRYL 3-0 PS-2 UND MONO (SUTURE) ×4 IMPLANT
SUT PDS AB 1 CT1 36 (SUTURE) IMPLANT
SUT PDS AB 1 CTX 36 (SUTURE) IMPLANT
SUT PLAIN 2 0 XLH (SUTURE) ×4 IMPLANT
SUT VIC AB 0 CT1 18XCR BRD8 (SUTURE) ×9 IMPLANT
SUT VIC AB 0 CT1 27 (SUTURE) ×2
SUT VIC AB 0 CT1 27XBRD ANBCTR (SUTURE) ×6 IMPLANT
SUT VIC AB 0 CT1 36 (SUTURE) IMPLANT
SUT VIC AB 0 CT1 8-18 (SUTURE) ×3
SUT VIC AB 3-0 SH 27 (SUTURE) ×4
SUT VIC AB 3-0 SH 27XBRD (SUTURE) ×12 IMPLANT
SUT VICRYL 0 TIES 12 18 (SUTURE) ×4 IMPLANT
SUT VICRYL 0 UR6 27IN ABS (SUTURE) ×8 IMPLANT
SYR 50ML LL SCALE MARK (SYRINGE) ×4 IMPLANT
SYR CONTROL 10ML LL (SYRINGE) IMPLANT
SYR TB 1ML LUER SLIP (SYRINGE) ×4 IMPLANT
TIP UTERINE 5.1X6CM LAV DISP (MISCELLANEOUS) IMPLANT
TIP UTERINE 6.7X10CM GRN DISP (MISCELLANEOUS) IMPLANT
TIP UTERINE 6.7X6CM WHT DISP (MISCELLANEOUS) ×4 IMPLANT
TIP UTERINE 6.7X8CM BLUE DISP (MISCELLANEOUS) IMPLANT
TOWEL OR 17X24 6PK STRL BLUE (TOWEL DISPOSABLE) ×12 IMPLANT
TRAY FOLEY CATH 14FR (SET/KITS/TRAYS/PACK) ×4 IMPLANT
TROCAR BALLN 12MMX100 BLUNT (TROCAR) IMPLANT
TROCAR Z-THREAD FIOS 11X100 BL (TROCAR) ×8 IMPLANT
TROCAR Z-THREAD FIOS 5X100MM (TROCAR) ×4 IMPLANT
TUBING FILTER THERMOFLATOR (ELECTROSURGICAL) ×4 IMPLANT
TUBING NON-CON 1/4 X 20 CONN (TUBING) ×4 IMPLANT
WARMER LAPAROSCOPE (MISCELLANEOUS) ×4 IMPLANT
WATER STERILE IRR 1000ML POUR (IV SOLUTION) ×4 IMPLANT

## 2012-07-28 NOTE — Op Note (Signed)
Preop Diagnosis: Symptomatic Fibroids   Postop Diagnosis: Symptomatic Fibroids   Procedure: DIAGNOSTIC LAPAROSCOPY HYSTERECTOMY ABDOMINAL BILATERAL SALPINGO OOPHERECTOMY CYSTOSCOPY  Anesthesia: General   Anesthesiologist: Dana Allan, MD   Attending: Purcell Nails, MD   Assistant: Jaymes Graff, MD  Findings: Multiple fibroids  Pathology: Uterus and cervix and bilateral ovaries and tubes weighing greater than 400g  Fluids: 4000 cc  UOP: 475  EBL: 1000 cc  Complications: None  Procedure: The patient received intravenous antibiotics and had sequential compression devices applied to her lower extremities while in the preoperative area.   She was taken to the operating room and placed under general anesthesia without difficulty.The abdomen and perineum were prepped and draped in a sterile manner, and she was placed in a dorsal supine position.  A Foley catheter was inserted into the bladder and attached to constant drainage. After an adequate timeout was performed, a Pfannensteil skin incision was made. This incision was taken down to the fascia using electrocautery with care given to maintain good hemostasis. The fascia was incised in the midline and the fascial incision was then extended bilaterally using electrocautery without difficulty. The fascia was then dissected off the underlying rectus muscles using blunt and sharp dissection. The rectus muscles were split bluntly in the midline and the peritoneum entered sharply without complication. This peritoneal incision was then extended superiorly and inferiorly with care given to prevent bowel or bladder injury. Attention was then turned to the pelvis. A retractor was placed into the incision, and the bowel was packed away with moist laparotomy sponges. The uterus at this point was noted to be mobilized and was delivered up out of the abdomen.  The bowel was packed away with moist laparotomy sponges. The round ligaments on each  side were clamped, suture ligated with 0 Vicryl, and transected with electrocautery allowing entry into the broad ligament. Of note, all sutures used in this procedure are 0 Vicryl unless otherwise noted.  Adnexae were clamped on the patient's right side, cut, and doubly suture ligated. This procedure was repeated in an identical fashion on the left site allowing for both adnexa to remain in place.    A bladder flap was then created.  The bladder was then bluntly dissected off the lower uterine segment and cervix with good hemostasis noted. The uterine arteries were then skeletonized bilaterally after performing partial myomectomy with dilute pitressin and then clamped, cut, and doubly suture ligated with care given to prevent ureteral injury.  The uterus was then amputated across the lower uterine segment leaving the cervix intact. The specimen was sent to pathology.   The uterosacral ligaments were then clamped, cut, and ligated bilaterally.  Finally, the cardinal ligaments were clamped, cut, and ligated bilaterally.  Acutely curved clamps were placed across the vagina just under the cervix, and the specimen was amputated and sent to pathology. The vaginal cuff angles were closed with Heaney stiches with care given to incorporate the uterosacral-cardinal ligament pedicles on both sides. The middle of the vaginal cuff was closed with a series of interrupted figure-of-eight sutures with care given to incorporate the anterior pubocervical fascia and the posterior rectovaginal fascia.   Several bleeders were made hemostatic with hemostatic clips and sutures of 3-0 vicryl. The pelvis was irrigated and hemostasis was reconfirmed at all pedicles and along the pelvic sidewall.  The right ovary and fallopian tube was then elevated with the babcock and kelly clamp placed beneath on the infundibulopelvic ligament.  The ovary and fallopian tube were  excised and sent to pathology.  The same was done on the contralateral  side. Surgicel was placed on the vaginal cuff.  All laparotomy sponges and instruments were removed from the abdomen. The peritoneum was closed with a running stitch of 2-0 chromic, and the fascia was also closed in a running fashion with 0 vicryl. The subcutaneous layer was reapproximated with 2-0 plain gut after placing a size 7 JP drain which was sutured in place with 2-0 silk. The skin was closed with a 3-0 monocryl subcuticular stitch. Sponge, lap, needle, and instrument counts were correct times two. The patient was taken to the recovery area awake, extubated and in stable condition.  An additional dose of cefotetan was administered during the case secondary to increased blood loss.

## 2012-07-28 NOTE — Transfer of Care (Signed)
Immediate Anesthesia Transfer of Care Note  Patient: Terri Rivera  Procedure(s) Performed: Procedure(s) (LRB) with comments: HYSTERECTOMY ABDOMINAL (N/A) LAPAROSCOPY DIAGNOSTIC (N/A) SALPINGO OOPHERECTOMY (Bilateral) CYSTOSCOPY (N/A)  Patient Location: PACU  Anesthesia Type:General  Level of Consciousness: awake and sedated  Airway & Oxygen Therapy: Patient Spontanous Breathing and Patient connected to nasal cannula oxygen  Post-op Assessment: Report given to PACU RN and Post -op Vital signs reviewed and stable  Post vital signs: Reviewed and stable  Complications: No apparent anesthesia complications

## 2012-07-28 NOTE — H&P (View-Only) (Signed)
Terri Rivera is a 50 y.o. female G1P0010 who presents for hysterectomy because of symptomatic uterine fibroids. Over the past five months, patient has had heavy bleeding with one episode that lasted for ten days requiring a super pad change 3 times a day. Normally she will only bleed for 3 days and change her pad once a day. Additionally, with the heavier bleeding, she will experience cramping rated at 10/10 on a 10 point pain scale that will only decrease to 9/10 with Ibuprofen 600 mg. A pelvic ultrasound with sonohysterogram in Septiember revealed: uterus-10.4 x 10.1 x 7.44 cm with diffuse fibroid enlargement: left lateral subserosal-8.6 x 6.3 x 7.9 cm; anterior midline-2.9 x 2.7 x 2.7 cm; midline fundal-3.3 x 2.9 x 2.7 cm and several lower uterine segment fibroids that appear to have a submucosal component; left ovary appears normal and right ovary has an echogenic 8 mm focus. An endometrial biopsy done at the same time returned normal-no hyperplasia, atypia or malignancy. Lab results were also normal (i.e. TSH, Prolactin and CBC). Patient admits to urinary urgency/hesitancy/frequency and constipation, but denies intermenstrual bleeding, vaginitis symptoms or dyspareunia.   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 ; Last PAP smear: July 2013 showed Atypical Cells of Undetermined Significance and positive high risk HPV but subsequent colposcopy did not reveal any abnormalities.   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 B Complex-Folic Acid (B COMPLEX PLUS PO) Take 1,000 mcg by mouth every morning.  . desvenlafaxine (PRISTIQ) 50 MG 24 hr tablet Take 50 mg by mouth daily.  . diazepam (VALIUM) 10 MG tablet Take 10 mg by mouth every 8 (eight) hours as needed. For anxiety  . ibuprofen (ADVIL,MOTRIN) 600 MG tablet Take 1 tablet (600 mg total) by mouth every 6 (six) hours as needed for pain. 30 tablet 0  . oxyCODONE (OXY IR/ROXICODONE) 5 MG immediate release tablet Take 1 tablet (5 mg total) by mouth every 4 (four) hours as needed for pain. 30 tablet 0  . zolpidem (AMBIEN) 10 MG tablet Take 10 mg by mouth at bedtime as needed. For sleep, only takes while working   Allergies  Allergen Reactions  . Codeine Hives  . Tylenol (Acetaminophen) Itching   Denies sensitivity to peanuts, shellfish, soy, latex or adhesives.   ROS: Admits to partial upper dental plate but denies headache, vision changes, nasal congestion, dysphagia, tinnitus, dizziness, hoarseness, cough, chest pain, shortness of breath, nausea, vomiting, diarrhea, dysuria, hematuria, vaginitis symptoms, pelvic pain, swelling of joints,easy bruising, myalgias, arthralgias, skin rashes, unexplained weight loss and except as is mentioned in the history of present illness, patient's review of systems is otherwise negative.    Physical Exam  BP 102/60  Pulse 62  Temp 98.2 F (36.8 C) (Oral)  Resp 14  Ht 5' 4" (1.626 m)  Wt 151 lb (68.493 kg)  BMI 25.92 kg/m2  LMP 06/16/2012  Neck: supple without masses or thyromegaly  Lungs: clear to auscultation  Heart: regular rate and rhythm  Abdomen: soft, non-tender and   no organomegaly  Pelvic:EGBUS- wnl; vagina-normal rugae; uterus-irregular, tender, 14-16 weeks size; cervix without lesions or motion tenderness; adnexae-no tenderness or separable masses though obliterated by the mass effect of the uterus  Extremities: no clubbing, cyanosis  or edema    Assesment: Symptomatic Uterine Fibroids                       Menorrhagia                       Pelvic Pain    Disposition: A discussion was held with patient regarding the indication for her procedure(s) along with the risks, which include but are not limited to: reaction to anesthesia, damage to adjacent organs, infection, pelvic prolapse and excessive bleeding. Patient verbalized understanding of these risks and has consented to proceed with a Total Laparoscopic Hysterectomy with possible, Laparoscopically Assisted Vaginal Hysterectomy, possible Total Abdominal Hysterectomy with Bilateral Salpingo-oophorectomy and possible Cystoscopy at [facility], July 28, 2012 at 1:30 p.m.   CSN# [id number]  Latunya Kissick J. Marla Pouliot, PA-C for Dr. Angela Y. Roberts  

## 2012-07-28 NOTE — Anesthesia Procedure Notes (Signed)
Procedure Name: Intubation Date/Time: 07/28/2012 1:50 PM Performed by: Graciela Husbands Pre-anesthesia Checklist: Suction available, Emergency Drugs available, Timeout performed, Patient identified and Patient being monitored Patient Re-evaluated:Patient Re-evaluated prior to inductionOxygen Delivery Method: Circle system utilized Preoxygenation: Pre-oxygenation with 100% oxygen Intubation Type: IV induction Ventilation: Mask ventilation without difficulty Laryngoscope Size: Mac and 3 Grade View: Grade I Tube size: 6.0 mm Number of attempts: 1 Airway Equipment and Method: Stylet Placement Confirmation: ETT inserted through vocal cords under direct vision,  positive ETCO2 and breath sounds checked- equal and bilateral Secured at: 20 cm Tube secured with: Tape Dental Injury: Teeth and Oropharynx as per pre-operative assessment

## 2012-07-28 NOTE — Anesthesia Postprocedure Evaluation (Signed)
Anesthesia Post Note  Patient: Terri Rivera  Procedure(s) Performed: Procedure(s) (LRB): HYSTERECTOMY ABDOMINAL (N/A) LAPAROSCOPY DIAGNOSTIC (N/A) SALPINGO OOPHERECTOMY (Bilateral) CYSTOSCOPY (N/A)  Anesthesia type: General  Patient location: PACU  Post pain: Pain level controlled  Post assessment: Post-op Vital signs reviewed  Last Vitals:  Filed Vitals:   07/28/12 1845  BP: 128/68  Pulse: 89  Temp:   Resp: 10    Post vital signs: Reviewed  Level of consciousness: sedated  Complications: No apparent anesthesia complications

## 2012-07-28 NOTE — Progress Notes (Signed)
Day of Surgery Procedure(s) (LRB): HYSTERECTOMY ABDOMINAL (N/A) LAPAROSCOPY DIAGNOSTIC (N/A) SALPINGO OOPHERECTOMY (Bilateral) CYSTOSCOPY (N/A)  Subjective: Patient reports incisional pain.  Improving now s/p dilaudid.  Objective: I have reviewed patient's vital signs and intake and output.  200cc uop/2 1/2 hrs  General: alert Resp: clear to auscultation bilaterally Cardio: regular rate and rhythm GI: soft, app tender, nd, JP drain in place, dressing c/d/i and dec bs Extremities: Homans sign is negative, no sign of DVT Vaginal Bleeding: none  Assessment: s/p Procedure(s) (LRB) with comments: HYSTERECTOMY ABDOMINAL (N/A) LAPAROSCOPY DIAGNOSTIC (N/A) SALPINGO OOPHERECTOMY (Bilateral) CYSTOSCOPY (N/A): stable  Plan: labs in am Adequate uop Reduced dose dilaudid pca and toradol H/o problems with constipation - reglan ordered IS SCDs for DVT prophylaxis  LOS: 0 days    Curry Dulski Y 07/28/2012, 8:22 PM

## 2012-07-28 NOTE — Interval H&P Note (Signed)
History and Physical Interval Note:  07/28/2012 1:21 PM  Terri Rivera  has presented today for surgery, with the diagnosis of Symptomatic Fibroids; 3.5 hours  The various methods of treatment have been discussed with the patient and family. After consideration of risks, benefits and other options for treatment, the patient has consented to  Procedure(s) (LRB) with comments: HYSTERECTOMY TOTAL LAPAROSCOPIC (N/A) LAPAROSCOPIC ASSISTED VAGINAL HYSTERECTOMY (N/A) HYSTERECTOMY ABDOMINAL (N/A) SALPINGO OOPHERECTOMY (Bilateral) as a surgical intervention .  The patient's history has been reviewed, patient examined, no change in status, stable for surgery.  I have reviewed the patient's chart and labs.  Questions were answered to the patient's satisfaction.     [name]  Pt understands that because of the size of her uterus, she will likely have a TAH but will do eua and make decision.

## 2012-07-28 NOTE — Anesthesia Preprocedure Evaluation (Signed)
Anesthesia Evaluation  Patient identified by MRN, date of birth, ID band Patient awake    Reviewed: Allergy & Precautions, H&P , Patient's Chart, lab work & pertinent test results, reviewed documented beta blocker date and time   Airway Mallampati: II TM Distance: >3 FB Neck ROM: full    Dental No notable dental hx.    Pulmonary  breath sounds clear to auscultation  Pulmonary exam normal       Cardiovascular Rhythm:regular Rate:Normal     Neuro/Psych    GI/Hepatic   Endo/Other    Renal/GU      Musculoskeletal   Abdominal   Peds  Hematology   Anesthesia Other Findings Multiple GA without difficulty from previous tracheostomy  Reproductive/Obstetrics                           Anesthesia Physical Anesthesia Plan  ASA: II  Anesthesia Plan: General   Post-op Pain Management:    Induction: Intravenous  Airway Management Planned: Oral ETT  Additional Equipment:   Intra-op Plan:   Post-operative Plan:   Informed Consent: I have reviewed the patients History and Physical, chart, labs and discussed the procedure including the risks, benefits and alternatives for the proposed anesthesia with the patient or authorized representative who has indicated his/her understanding and acceptance.   Dental Advisory Given and Dental advisory given  Plan Discussed with: CRNA and Surgeon  Anesthesia Plan Comments: (  Discussed  general anesthesia, including possible nausea, instrumentation of airway, sore throat,pulmonary aspiration, etc. I asked if the were any outstanding questions, or  concerns before we proceeded. )        Anesthesia Quick Evaluation

## 2012-07-29 ENCOUNTER — Telehealth: Payer: Self-pay | Admitting: Obstetrics and Gynecology

## 2012-07-29 ENCOUNTER — Encounter (HOSPITAL_COMMUNITY): Payer: Self-pay

## 2012-07-29 LAB — CBC
HCT: 25 % — ABNORMAL LOW (ref 36.0–46.0)
Hemoglobin: 8.2 g/dL — ABNORMAL LOW (ref 12.0–15.0)
MCH: 27.2 pg (ref 26.0–34.0)
MCHC: 32.8 g/dL (ref 30.0–36.0)
MCV: 83.1 fL (ref 78.0–100.0)

## 2012-07-29 LAB — TYPE AND SCREEN
ABO/RH(D): O POS
Antibody Screen: NEGATIVE
Unit division: 0

## 2012-07-29 MED ORDER — HYDROMORPHONE HCL 2 MG PO TABS
2.0000 mg | ORAL_TABLET | ORAL | Status: DC | PRN
  Administered 2012-07-29: 2 mg via ORAL
  Administered 2012-07-30: 4 mg via ORAL
  Administered 2012-07-30 (×2): 2 mg via ORAL
  Filled 2012-07-29 (×2): qty 1
  Filled 2012-07-29: qty 2
  Filled 2012-07-29 (×3): qty 1

## 2012-07-29 MED ORDER — KETOROLAC TROMETHAMINE 30 MG/ML IJ SOLN
30.0000 mg | Freq: Four times a day (QID) | INTRAMUSCULAR | Status: AC
  Administered 2012-07-30 (×2): 30 mg via INTRAVENOUS
  Filled 2012-07-29 (×2): qty 1

## 2012-07-29 MED ORDER — DESVENLAFAXINE SUCCINATE ER 50 MG PO TB24
50.0000 mg | ORAL_TABLET | Freq: Every day | ORAL | Status: DC
  Administered 2012-07-29: 50 mg via ORAL
  Filled 2012-07-29 (×2): qty 1

## 2012-07-29 MED FILL — Heparin Sodium (Porcine) Inj 5000 Unit/ML: INTRAMUSCULAR | Qty: 1 | Status: AC

## 2012-07-29 NOTE — Progress Notes (Addendum)
Pt very drowsy on arrival from PACU, stimulation by RN necessary every 2-3 min. Pt arouses, OX4, but drifts right back to sleep. Respirations 6-8 when pt dozes. RR up to 12-16 with stimulation.

## 2012-07-29 NOTE — Telephone Encounter (Signed)
Pending reference number for 07/28/12 CPT Code 16109  [id number] per Marisue Ivan @ [employer] -Adrianne Pridgen

## 2012-07-29 NOTE — Addendum Note (Signed)
Addendum  created 07/29/12 0800 by Earmon Phoenix, CRNA   Modules edited:Notes Section

## 2012-07-29 NOTE — Progress Notes (Signed)
Narcan given d/t decreased resp., pt continues to be drowsy.

## 2012-07-29 NOTE — Progress Notes (Signed)
Ur chart review completed.  

## 2012-07-29 NOTE — H&P (Signed)
Terri Rivera is a 51 y.o. female G1P0010 who presents for hysterectomy because of symptomatic uterine fibroids. Over the past five months, patient has had heavy bleeding with one episode that lasted for ten days requiring a super pad change 3 times a day. Normally she will only bleed for 3 days and change her pad once a day. Additionally, with the heavier bleeding, she will experience cramping rated at 10/10 on a 10 point pain scale that will only decrease to 9/10 with Ibuprofen 600 mg. A pelvic ultrasound with sonohysterogram in Septiember revealed: uterus-10.4 x 10.1 x 7.44 cm with diffuse fibroid enlargement: left lateral subserosal-8.6 x 6.3 x 7.9 cm; anterior midline-2.9 x 2.7 x 2.7 cm; midline fundal-3.3 x 2.9 x 2.7 cm and several lower uterine segment fibroids that appear to have a submucosal component; left ovary appears normal and right ovary has an echogenic 8 mm focus. An endometrial biopsy done at the same time returned normal-no hyperplasia, atypia or malignancy. Lab results were also normal (i.e. TSH, Prolactin and CBC). Patient admits to urinary urgency/hesitancy/frequency and constipation, but denies intermenstrual bleeding, vaginitis symptoms or dyspareunia.   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 ; Last PAP smear: July 2013 showed Atypical Cells of Undetermined Significance and positive high risk HPV but subsequent colposcopy did not reveal any abnormalities.   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 B Complex-Folic Acid (B COMPLEX PLUS PO) Take 1,000 mcg by mouth every morning.  . desvenlafaxine (PRISTIQ) 50 MG 24 hr tablet Take 50 mg by mouth daily.  . diazepam (VALIUM) 10 MG tablet Take 10 mg by mouth every 8 (eight) hours as needed. For anxiety  . ibuprofen (ADVIL,MOTRIN) 600 MG tablet Take 1 tablet (600 mg total) by mouth every 6 (six) hours as needed for pain. 30 tablet 0  . oxyCODONE (OXY IR/ROXICODONE) 5 MG immediate release tablet Take 1 tablet (5 mg total) by mouth every 4 (four) hours as needed for pain. 30 tablet 0  . zolpidem (AMBIEN) 10 MG tablet Take 10 mg by mouth at bedtime as needed. For sleep, only takes while working   Allergies  Allergen Reactions  . Codeine Hives  . Tylenol (Acetaminophen) Itching   Denies sensitivity to peanuts, shellfish, soy, latex or adhesives.   ROS: Admits to partial upper dental plate but denies headache, vision changes, nasal congestion, dysphagia, tinnitus, dizziness, hoarseness, cough, chest pain, shortness of breath, nausea, vomiting, diarrhea, dysuria, hematuria, vaginitis symptoms, pelvic pain, swelling of joints,easy bruising, myalgias, arthralgias, skin rashes, unexplained weight loss and except as is mentioned in the history of present illness, patient's review of systems is otherwise negative.    Physical Exam  BP 102/60  Pulse 62  Temp 98.2 F (36.8 C) (Oral)  Resp 14  Ht 5\' 4"  (1.626 m)  Wt 151 lb (68.493 kg)  BMI 25.92 kg/m2  LMP 06/16/2012  Neck: supple without masses or thyromegaly  Lungs: clear to auscultation  Heart: regular rate and rhythm  Abdomen: soft, non-tender and  no organomegaly  Pelvic:EGBUS- wnl; vagina-normal rugae; uterus-irregular, tender, 14-16 weeks size; cervix without lesions or motion tenderness; adnexae-no tenderness or separable masses though obliterated by the mass effect of the uterus  Extremities: no clubbing, cyanosis  or edema    Assesment: Symptomatic Uterine Fibroids                       Menorrhagia                       Pelvic Pain    Disposition: A discussion was held with patient regarding the indication for her procedure(s) along with the risks, which include but are not limited to: reaction to anesthesia, damage to adjacent organs, infection, pelvic prolapse and excessive bleeding. Patient verbalized understanding of these risks and has consented to proceed with a Total Laparoscopic Hysterectomy with possible, Laparoscopically Assisted Vaginal Hysterectomy, possible Total Abdominal Hysterectomy with Bilateral Salpingo-oophorectomy and possible Cystoscopy at [facility], July 28, 2012 at 1:30 p.m.   CSN# [id number]  Kariana Wiles J. Lowell Guitar, PA-C for Dr. Woodroe Mode. Su Hilt

## 2012-07-29 NOTE — Progress Notes (Signed)
Notified Dr Su Hilt of pt lethargy and and respiratory rate. Will continue to monitor for now.

## 2012-07-29 NOTE — Progress Notes (Signed)
Terri Rivera is a50 y.o.  [id number]  Post Op Date 1:  Diagnostic Laparoscopy, TAH/BSO with Cystoscopy  Subjective: Patient is Doing well postoperatively. now alert and spontaneously interactive. Patient has good pain control with oral analgesia., Has ambulated in the hall without dizziness, tolerating liquids without nausea but denies flatus and still has Foley.  Objective: Vital signs in last 24 hours: Temp:  [97.7 F (36.5 C)-99.4 F (37.4 C)] 98.1 F (36.7 C) (10/30 0550) Pulse Rate:  [78-103] 83  (10/30 0550) Resp:  [6-18] 18  (10/30 0550) BP: (102-153)/(60-98) 115/60 mmHg (10/30 0550) SpO2:  [93 %-100 %] 100 % (10/30 0550) Weight:  [154 lb (69.854 kg)] 154 lb (69.854 kg) (10/29 2106)  Intake/Output from previous day: 10/29 0701 - 10/30 0700 In: 6410 [P.O.:860; I.V.:5550] Out: 2530 [Urine:1490; Drains:40] Intake/Output this shift:    Lab 07/29/12 0535  WBC 10.3  HGB 8.2*  HCT 25.0*  PLT 218    No results found for this basename: NA:3,K:3,CL:3,CO2:3,BUN:3,CREATININE:3,CALCIUM:3,LABALBU:3,PROT:3,BILITOT:3,ALKPHOS:3,ALT:3,AST:3,GLUCOSE:3 in the last 168 hours  EXAM: General: alert, cooperative and no distress Resp: Bilateral rhonchi at bases but no wheezes or rales. Cardio: regular rate and rhythm, S1, S2 normal, no murmur, click, rub or gallop GI: Bowel sounds present, soft, JP drain with scant drainage, lower abdominal dressing and umbilical dressing clean, dry and intact. Extremities: SCD hose in place and functioning, no calf tenderness. Vaginal Bleeding: scant    Assessment: s/p Procedure(s): HYSTERECTOMY ABDOMINAL LAPAROSCOPY DIAGNOSTIC SALPINGO OOPHERECTOMY CYSTOSCOPY: anemia and progressing well  Plan: Advance diet Encourage ambulation Discontinue Foley and Rountine care.  LOS: 1 day    POWELL,ELMIRA, PA-C 07/29/2012 7:25 AM  Pt with asymptomatic anemia doing well except requesting to restart her pristiq and better pain control.  Will  restart toradol IV for another 3 doses and increase to 2-4mg  of dilaudid prn.  Denies flatus but tolerating po well.  Plan to remove dressing tomorrow.  If doing as well as she is doing now may consider d/c home tomorrow pm.  Will eval JP drain tomorrow.  Voiding spontaneously.

## 2012-07-29 NOTE — Progress Notes (Addendum)
RN has remained at the [facility] d/t patients Resp. Depression. Visitors at the bedside, pt dozes off in mid sentence. RR down to 6-8, with  Stimulation respirations up to 14-16

## 2012-07-29 NOTE — Progress Notes (Signed)
House supervisor at the patient bedside for resource of respiratory status.

## 2012-07-29 NOTE — Anesthesia Postprocedure Evaluation (Signed)
  Anesthesia Post-op Note  Patient: Terri Rivera  Procedure(s) Performed: Procedure(s) (LRB) with comments: HYSTERECTOMY ABDOMINAL (N/A) LAPAROSCOPY DIAGNOSTIC (N/A) SALPINGO OOPHERECTOMY (Bilateral) CYSTOSCOPY (N/A)  Patient Location: [facility]  Anesthesia Type:General  Level of Consciousness: awake, alert  and oriented  Airway and Oxygen Therapy: Patient Spontanous Breathing.   Post-op Pain: none  Post-op Assessment: Patient's Cardiovascular Status Stable, Respiratory Function Stable, No signs of Nausea or vomiting and Pain level controlled  Post-op Vital Signs: stable  Complications: No apparent anesthesia complications

## 2012-07-30 MED ORDER — IBUPROFEN 600 MG PO TABS: 600.0000 mg | ORAL_TABLET | Freq: Four times a day (QID) | ORAL | Status: DC | PRN

## 2012-07-30 MED ORDER — TRAMADOL HCL 50 MG PO TABS: 50.0000 mg | ORAL_TABLET | Freq: Four times a day (QID) | ORAL | Status: DC | PRN

## 2012-07-30 MED ORDER — OXYCODONE-ACETAMINOPHEN 5-325 MG PO TABS: 1.0000 | ORAL_TABLET | Freq: Once | ORAL | Status: DC

## 2012-07-30 MED ORDER — TRAMADOL HCL 50 MG PO TABS
50.0000 mg | ORAL_TABLET | Freq: Four times a day (QID) | ORAL | Status: DC | PRN
  Administered 2012-07-30: 50 mg via ORAL
  Filled 2012-07-30: qty 1

## 2012-07-30 MED ORDER — HYDROMORPHONE HCL 2 MG PO TABS: 2.0000 mg | ORAL_TABLET | Freq: Four times a day (QID) | ORAL | Status: DC | PRN

## 2012-07-30 MED ORDER — ONDANSETRON HCL 4 MG PO TABS: 4.0000 mg | ORAL_TABLET | Freq: Three times a day (TID) | ORAL | Status: DC | PRN

## 2012-07-30 NOTE — Progress Notes (Addendum)
Terri Rivera is a50 y.o.  [id number]  Post Op Date # 2; Diagnostic Laparoscopy, Abdominal Hysterectomy/BSO and Cystoscopy  Subjective: Patient is Doing well postoperatively. Patient has Pain controlled with oral analgesia and IV NSAID,  Tolerating a regular diet, voiding and ambulating in the halls without difficulty.  Objective: Vital signs in last 24 hours: Temp:  [97.7 F (36.5 C)-98.5 F (36.9 C)] 98.2 F (36.8 C) (10/31 0521) Pulse Rate:  [90-99] 93  (10/31 0521) Resp:  [16-19] 17  (10/31 0521) BP: (88-114)/(55-78) 88/55 mmHg (10/31 0521) SpO2:  [93 %-100 %] 99 % (10/31 0521)  Intake/Output from previous day: 10/30 0701 - 10/31 0700 In: 1560 [P.O.:1560] Out: 1435 [Urine:1375; Drains:60] Intake/Output this shift:    Lab 07/29/12 0535  WBC 10.3  HGB 8.2*  HCT 25.0*  PLT 218    No results found for this basename: NA:3,K:3,CL:3,CO2:3,BUN:3,CREATININE:3,CALCIUM:3,LABALBU:3,PROT:3,BILITOT:3,ALKPHOS:3,ALT:3,AST:3,GLUCOSE:3 in the last 168 hours  EXAM: General: alert, cooperative and no distress Resp: clear to auscultation bilaterally Cardio: regular rate and rhythm, S1, S2 normal, no murmur, click, rub or gallop GI: incision: intact, no evidence of infection with only scant sanguinous fluid in JP drain. drainage present and  and  Extremities: SCD hose  in place and functioning, no calf tenderness.    Assessment: s/p Procedure(s): HYSTERECTOMY ABDOMINAL LAPAROSCOPY DIAGNOSTIC SALPINGO OOPHERECTOMY CYSTOSCOPY: stable, progressing well and anemia  Plan: Remove dressing and prepare for probable discharge later today.  LOS: 2 days    POWELL,ELMIRA, PA-C 07/30/2012 7:26 AM  Pt itching with the dilaudid.  Will try ultram instead.  Pt is asking to go home.  She is doing well.  D/c instructions discussed.  She is ambulating without difficulty with no light headedness or dizziness.  Her JP has put out 50cc/12hrs. Only small spots on pad.  Filed Vitals:   07/30/12  1726  BP: 113/62  Pulse: 85  Temp: 97.7 F (36.5 C)  Resp: 18   Pt will call office to return on Tues to have JP drain removed.

## 2012-07-30 NOTE — Progress Notes (Signed)
Patient discharged home with husband. Taken down by wheelchair via [place], [place]. No home care equipment needed. Patient in stable condition.

## 2012-07-30 NOTE — Discharge Summary (Signed)
Physician Discharge Summary  Patient ID: Terri Rivera MRN: [id number] DOB/AGE: January 18, 1961 51 y.o.  Admit date: 07/28/2012 Discharge date: 07/30/2012  Admission Diagnoses: Symptomatic fibroids admitted for hysterectomy  Discharge Diagnoses: same Active Problems:  * No active hospital problems. *    Discharged Condition: good  Hospital Course: admitted for hysterectomy.  Pt underwent TAH and although had significant EBL has tolerated it well postoperatively and has had an uneventful course.  She has has some itching with dilaudid so have sent pt home with ultram.  She is going home with JP drain secondary to still putting out serosanguinous fluid and will have pt return to office on Tues to have it removed.  Consults: None  Significant Diagnostic Studies: labs  Treatments: surgery: s/p TAH and cystoscopy  Discharge Exam: Blood pressure 113/62, pulse 85, temperature 97.7 F (36.5 C), temperature source Oral, resp. rate 18, height 5\' 4"  (1.626 m), weight 154 lb (69.854 kg), SpO2 96.00%. General appearance: alert Resp: clear to auscultation bilaterally Cardio: regular rate and rhythm GI: soft, app tender, incision c/d/steristrips intact, jp drain in place, nd, nabs Extremities: Homans sign is negative, no sign of DVT Incision/Wound: no active vag bleeding  Disposition: Final discharge disposition not confirmed  Discharge Orders    Future Appointments: Provider: Department: Dept Phone: Center:   09/02/2012 11:15 AM Purcell Nails, MD [facility] [phone] None       Medication List     As of 07/30/2012  6:01 PM    STOP taking these medications         oxyCODONE 5 MG immediate release tablet   Commonly known as: Oxy IR/ROXICODONE      TAKE these medications         B COMPLEX PLUS PO   Take 1,000 mcg by mouth every morning.      desvenlafaxine 50 MG 24 hr tablet   Commonly known as: PRISTIQ   Take 50 mg by mouth daily.      diazepam 10 MG tablet   Commonly known as: VALIUM   Take 10 mg by mouth every 8 (eight) hours as needed. For anxiety      HYDROmorphone 2 MG tablet   Commonly known as: DILAUDID   Take 1-2 tablets (2-4 mg total) by mouth every 6 (six) hours as needed (may increase dose to 4 mg every 6 hours for severe pain).      ibuprofen 600 MG tablet   Commonly known as: ADVIL,MOTRIN   Take 1 tablet (600 mg total) by mouth every 6 (six) hours as needed for pain.      ibuprofen 600 MG tablet   Commonly known as: ADVIL,MOTRIN   Take 1 tablet (600 mg total) by mouth every 6 (six) hours as needed for pain.      ondansetron 4 MG tablet   Commonly known as: ZOFRAN   Take 1 tablet (4 mg total) by mouth every 8 (eight) hours as needed for nausea.      traMADol 50 MG tablet   Commonly known as: ULTRAM   Take 1 tablet (50 mg total) by mouth every 6 (six) hours as needed for pain.      zolpidem 10 MG tablet   Commonly known as: AMBIEN   Take 10 mg by mouth at bedtime as needed. For sleep, only takes while working           Follow-up Information    Follow up with Purcell Nails, MD. On 09/02/2012. (Appointment time is 11:15  a.m.)    Contact information:   [address] [place] [address] [phone]          Signed: Purcell Nails 07/30/2012, 6:01 PM

## 2012-07-30 NOTE — Discharge Summary (Signed)
  Physician Discharge Summary  Patient ID: Terri Rivera MRN: [id number] DOB/AGE: 1961/02/24 51 y.o.  Admit date: 07/28/2012 Discharge date: 07/30/2012   Discharge Diagnoses: Symptomatic Uterine Fibroids, Menorrhagia and Pelvic Pain Active Problems:  * No active hospital problems. *    Operation: Diagnostic Laparoscopy, Total Abdominal Hysterectomy, Bilateral Salpingo-oophorectomy and Cystoscopy   Discharged Condition: good  Hospital Course: On the date of admission, the patient underwent aforementioned procedures and tolerated them all well.  Post operative course was initially marked by respiratory suppression attributed to PCA, however, this resolved quickly once oral analgesia was administered.  By post operative day number two the patient had resume bowel and bladder function and was tolerating a hemoglobin of 8.2.  Having received the maximum benefit of her hospital stay, the patient was discharged home.  Disposition: Home to self care  Discharge Medications:   Rita, [place]  Home Medication Instructions [id number]   Printed on:07/30/12 0749  Medication Information                    desvenlafaxine (PRISTIQ) 50 MG 24 hr tablet Take 50 mg by mouth daily.           diazepam (VALIUM) 10 MG tablet Take 10 mg by mouth every 8 (eight) hours as needed. For anxiety           zolpidem (AMBIEN) 10 MG tablet Take 10 mg by mouth at bedtime as needed. For sleep, only takes while working           ibuprofen (ADVIL,MOTRIN) 600 MG tablet Take 1 tablet (600 mg total) by mouth every 6 (six) hours as needed for pain.           B Complex-Folic Acid (B COMPLEX PLUS PO) Take 1,000 mcg by mouth every morning.           HYDROmorphone (DILAUDID) 2 MG tablet Take 1-2 tablets (2-4 mg total) by mouth every 6 (six) hours as needed (may increase dose to 4 mg every 6 hours for severe pain).           ondansetron (ZOFRAN) 4 MG tablet Take 1 tablet (4 mg total) by mouth every 8  (eight) hours as needed for nausea.              Follow-up: Dr. Su Hilt, September 02, 2012 at 11:15 a.m.   SignedHenreitta Leber, PA-C 07/30/2012, 7:49 AM

## 2012-07-31 ENCOUNTER — Telehealth: Payer: Self-pay

## 2012-07-31 NOTE — Telephone Encounter (Signed)
Spoke to pt to get her scheduled for JP drain removal on Tues. 08/04/2012. Melody Comas A

## 2012-08-04 ENCOUNTER — Ambulatory Visit (INDEPENDENT_AMBULATORY_CARE_PROVIDER_SITE_OTHER): Payer: 59 | Admitting: Obstetrics and Gynecology

## 2012-08-04 ENCOUNTER — Encounter: Payer: Self-pay | Admitting: Obstetrics and Gynecology

## 2012-08-04 VITALS — BP 110/60 | Ht 64.0 in | Wt 158.0 lb

## 2012-08-04 DIAGNOSIS — D649 Anemia, unspecified: Secondary | ICD-10-CM

## 2012-08-04 DIAGNOSIS — Z9889 Other specified postprocedural states: Secondary | ICD-10-CM

## 2012-08-04 NOTE — Progress Notes (Signed)
Here to have JP drain removed.  Feels light headed when initially sitting up.  Filed Vitals:   08/04/12 1358  BP: 110/60   JP drain with 25cc serosang fluid in it now Pt says there was another 25cc in there this morning from 8-12  A/P Will leav drain until Friday and take on Friday regardless CBC now Offered transfusion - pt declined will con to take iron and even inc to bid

## 2012-08-05 LAB — CBC
HCT: 23.1 % — ABNORMAL LOW (ref 36.0–46.0)
Hemoglobin: 7.7 g/dL — ABNORMAL LOW (ref 12.0–15.0)
MCV: 80.8 fL (ref 78.0–100.0)
WBC: 6 10*3/uL (ref 4.0–10.5)

## 2012-08-07 ENCOUNTER — Ambulatory Visit (INDEPENDENT_AMBULATORY_CARE_PROVIDER_SITE_OTHER): Payer: 59 | Admitting: Obstetrics and Gynecology

## 2012-08-07 ENCOUNTER — Encounter: Payer: Self-pay | Admitting: Obstetrics and Gynecology

## 2012-08-07 VITALS — BP 130/70 | Ht 64.0 in | Wt 151.0 lb

## 2012-08-07 DIAGNOSIS — Z90710 Acquired absence of both cervix and uterus: Secondary | ICD-10-CM

## 2012-08-07 DIAGNOSIS — D649 Anemia, unspecified: Secondary | ICD-10-CM

## 2012-08-07 MED ORDER — TRAMADOL HCL 50 MG PO TABS: 50.0000 mg | ORAL_TABLET | Freq: Four times a day (QID) | ORAL | Status: DC | PRN

## 2012-08-07 NOTE — Addendum Note (Signed)
Addended by: Osborn Coho on: 08/07/2012 05:00 PM   Modules accepted: Orders

## 2012-08-07 NOTE — Progress Notes (Signed)
Here for removal of drain.  Denies any further lt headedness or dizziness upon standing.  Pt increased iron to BID  Filed Vitals:   08/07/12 1558  BP: 130/70   JP drain serous fluid less than 5cc over several hours per pt She reports emptying twice a day with about 5cc in it each time Incision at JP site dressed with benzoin and steristrips Incision healing well with steristrips still intact  A/P May shower Cont iron - may try liquid iron CBC today RTO for post op appt as scheduled

## 2012-08-08 LAB — CBC WITH DIFFERENTIAL/PLATELET
Basophils Relative: 1 % (ref 0–1)
Eosinophils Absolute: 0.4 10*3/uL (ref 0.0–0.7)
Eosinophils Relative: 7 % — ABNORMAL HIGH (ref 0–5)
HCT: 24 % — ABNORMAL LOW (ref 36.0–46.0)
Hemoglobin: 8.1 g/dL — ABNORMAL LOW (ref 12.0–15.0)
Lymphs Abs: 1.9 10*3/uL (ref 0.7–4.0)
MCH: 27.4 pg (ref 26.0–34.0)
MCHC: 33.8 g/dL (ref 30.0–36.0)
MCV: 81.1 fL (ref 78.0–100.0)
Monocytes Absolute: 0.5 10*3/uL (ref 0.1–1.0)
Monocytes Relative: 10 % (ref 3–12)
RBC: 2.96 MIL/uL — ABNORMAL LOW (ref 3.87–5.11)

## 2012-09-02 ENCOUNTER — Encounter: Payer: Self-pay | Admitting: Obstetrics and Gynecology

## 2012-09-02 ENCOUNTER — Ambulatory Visit (INDEPENDENT_AMBULATORY_CARE_PROVIDER_SITE_OTHER): Payer: 59 | Admitting: Obstetrics and Gynecology

## 2012-09-02 VITALS — BP 104/70 | Ht 64.0 in | Wt 143.0 lb

## 2012-09-02 DIAGNOSIS — Z90710 Acquired absence of both cervix and uterus: Secondary | ICD-10-CM

## 2012-09-02 DIAGNOSIS — D649 Anemia, unspecified: Secondary | ICD-10-CM

## 2012-09-02 LAB — CBC
HCT: 34.1 % — ABNORMAL LOW (ref 36.0–46.0)
MCH: 26.8 pg (ref 26.0–34.0)
MCV: 82.4 fL (ref 78.0–100.0)
RDW: 14 % (ref 11.5–15.5)
WBC: 4.9 10*3/uL (ref 4.0–10.5)

## 2012-09-02 NOTE — Progress Notes (Signed)
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 - WEAKLY PROLIFERATIVE-INACTIVE ENDOMETRIUM; NEGATIVE FOR HYPERPLASIA OR MALIGNANCY. - UTERINE ADENOMYOSIS. - BENIGN CERVICAL MUCOSA; NEGATIVE FOR INTRAEPITHELIAL LESION OR MALIGNANCY. - UNREMARKABLE UTERINE SEROSA. - BENIGN RIGHT AND LEFT FALLOPIAN TUBES; NO ATYPIA OR MALIGNANCY PRESENT. - BENIGN OVARIAN HEMORRHAGIC CORPUS LUTEAL CYST. - BENIGN OVARIAN SEROUS CYSTADENOFIBROMA (1.0 CM).  A/P RTO 6-8wks for further eval Cont out of work until [employer] in [employer] No ic for another week Cont iron and check cbc today

## 2012-09-03 ENCOUNTER — Telehealth: Payer: Self-pay

## 2012-09-03 NOTE — Telephone Encounter (Signed)
Called pt to let her know cbc result. Continue floridex til next office visit. Terri Rivera A

## 2012-09-07 ENCOUNTER — Telehealth: Payer: Self-pay | Admitting: Obstetrics and Gynecology

## 2012-09-07 NOTE — Telephone Encounter (Signed)
TC to pt.  States had postop appt with DR AR 124/13.  Had silver nitrate applied to vaginal cuff.  Has had yellow D/C since then. No odor. No itching.Spotting x 1. No IC.  Per EP, informed may last up to 2 weeks.To call with any odor, increase in D/C, spotting or any concerns. Pt verbalizes comprehension.

## 2012-09-07 NOTE — Telephone Encounter (Signed)
Returned pt's call. LM to return call.   

## 2012-10-14 ENCOUNTER — Encounter: Payer: Self-pay | Admitting: Obstetrics and Gynecology

## 2012-10-14 ENCOUNTER — Ambulatory Visit: Payer: 59 | Admitting: Obstetrics and Gynecology

## 2012-10-14 VITALS — BP 130/70 | Ht 64.0 in | Wt 141.0 lb

## 2012-10-14 DIAGNOSIS — Z01419 Encounter for gynecological examination (general) (routine) without abnormal findings: Secondary | ICD-10-CM

## 2012-10-14 DIAGNOSIS — D649 Anemia, unspecified: Secondary | ICD-10-CM

## 2012-10-14 LAB — CBC
Hemoglobin: 12.1 g/dL (ref 12.0–15.0)
MCH: 26.9 pg (ref 26.0–34.0)
MCHC: 32.8 g/dL (ref 30.0–36.0)
RDW: 13.9 % (ref 11.5–15.5)

## 2012-10-14 NOTE — Progress Notes (Signed)
Here to f/u granulation tissue and recheck CBC  Filed Vitals:   10/14/12 1020  BP: 130/70   Vaginal cuff well healed, no visible granulation tissue  A/P May resume IC and all normal activities May return to work on 1/29 as planned [employer] per pt will be calling office for clearance to return to work and will specifically need her Hgb/Hct results which were only mildly low at her last visit in December November for [employer]

## 2012-10-14 NOTE — Addendum Note (Signed)
Addended by: Mathis Bud on: 10/14/2012 11:21 AM   Modules accepted: Orders

## 2012-10-16 ENCOUNTER — Telehealth: Payer: Self-pay

## 2012-10-16 NOTE — Telephone Encounter (Signed)
Pt gave verbal permission to release Hemoglobin result to [place] at [employer]. Pt needed normal results given in order to return back to work.  Pt notified to confirm release of information. Mathis Bud

## 2012-10-27 ENCOUNTER — Encounter: Payer: 59 | Admitting: Obstetrics and Gynecology

## 2013-01-04 ENCOUNTER — Encounter: Payer: Self-pay | Admitting: *Deleted

## 2013-01-05 ENCOUNTER — Ambulatory Visit: Payer: Self-pay | Admitting: Family Medicine

## 2013-02-23 ENCOUNTER — Encounter: Payer: Self-pay | Admitting: Obstetrics and Gynecology

## 2013-06-30 ENCOUNTER — Other Ambulatory Visit: Payer: Self-pay | Admitting: Obstetrics and Gynecology

## 2013-06-30 DIAGNOSIS — Z1231 Encounter for screening mammogram for malignant neoplasm of breast: Secondary | ICD-10-CM

## 2013-07-06 ENCOUNTER — Ambulatory Visit: Payer: 59

## 2013-10-24 IMAGING — US US TRANSVAGINAL NON-OB
1 series · 13 of 25 positions shown · non-contrast
Comparison: 06/22/2010 study.

CLINICAL DATA: History of right-sided pelvic pain.  History of
uterine fibroids.  LMP 05/14/2012.

TRANSABDOMINAL AND TRANSVAGINAL ULTRASOUND OF PELVIS
TECHNIQUE: Both transabdominal and transvaginal ultrasound
examinations of the pelvis were performed. Transabdominal technique
was performed for global imaging of the pelvis including uterus,
ovaries, adnexal regions, and pelvic cul-de-sac.
It was necessary to proceed with endovaginal exam following the
transabdominal exam to visualize the details of the myometrium,
endometrium, and ovaries..

[Series 1: us transvaginal non-ob · 0.21mm/px · 13 of 144 slices shown]
[im 1/144]
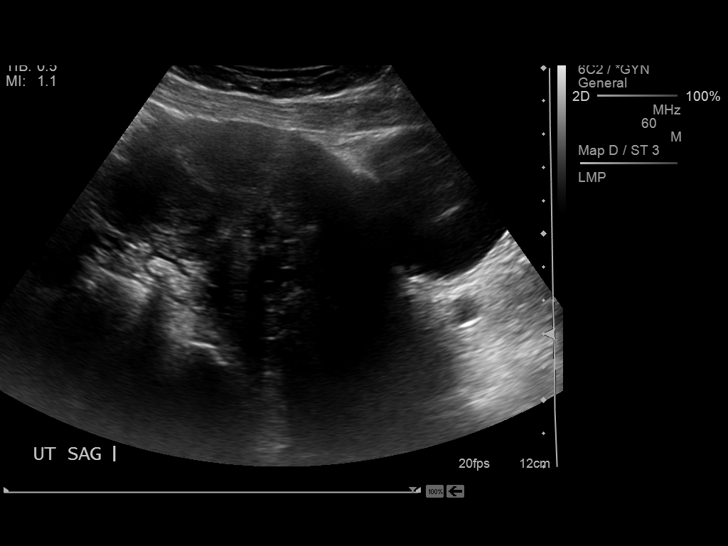
[im 12/144]
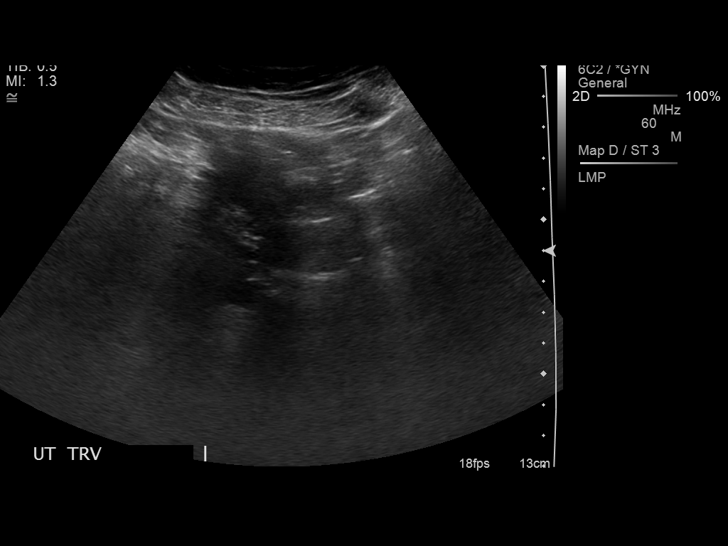
[im 24/144]
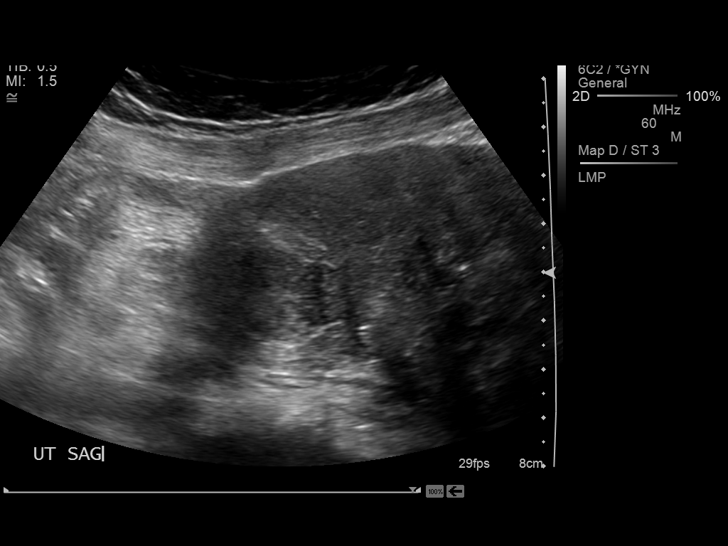
[im 36/144]
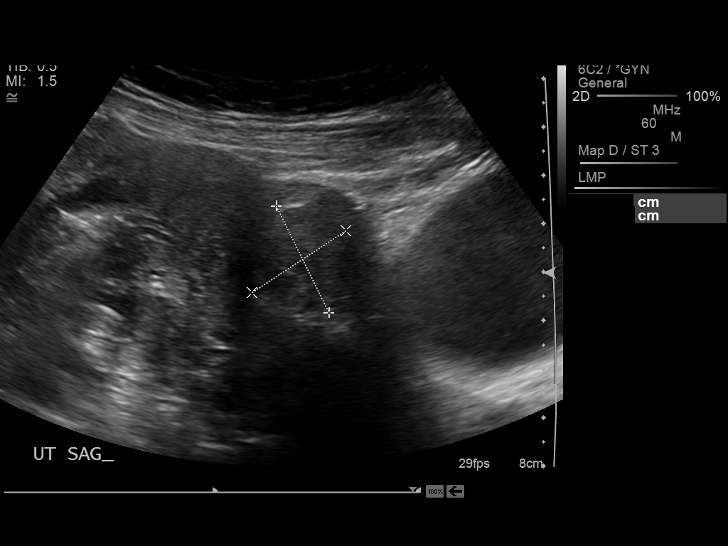
[im 48/144]
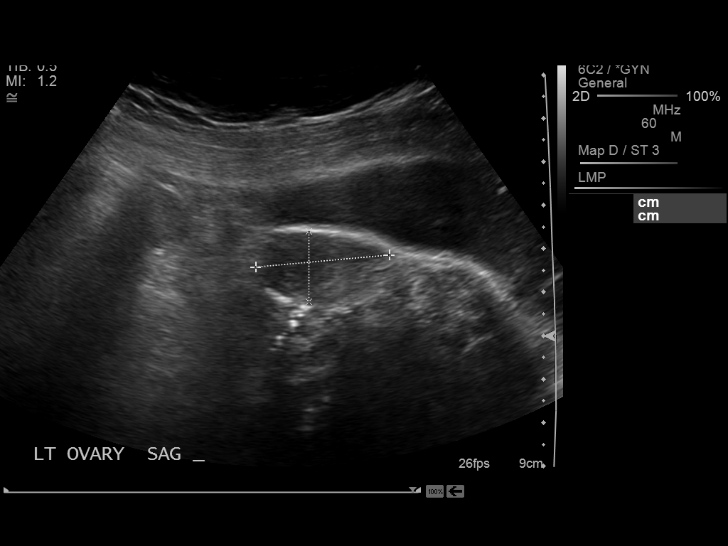
[im 60/144]
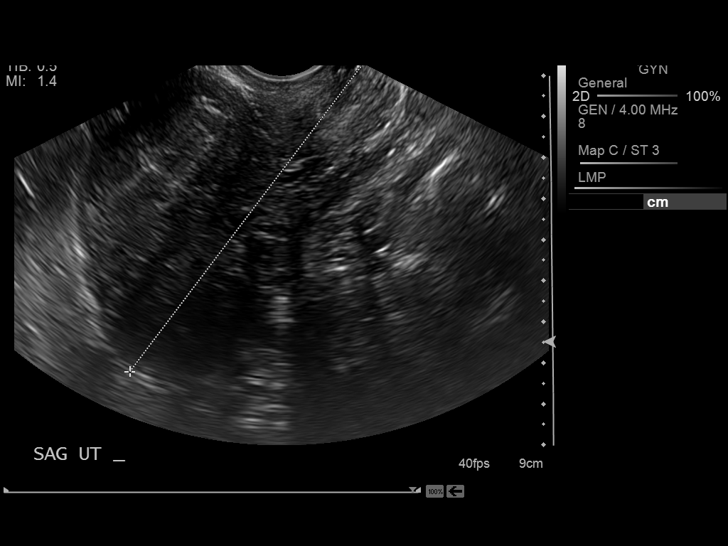
[im 72/144]
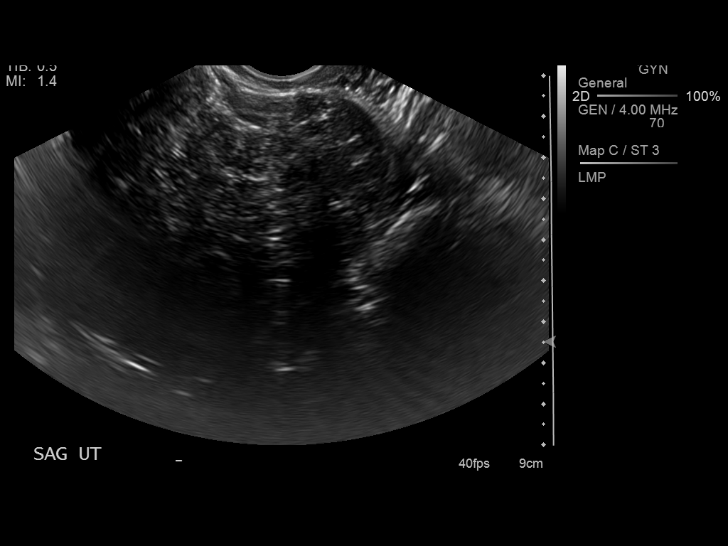
[im 84/144]
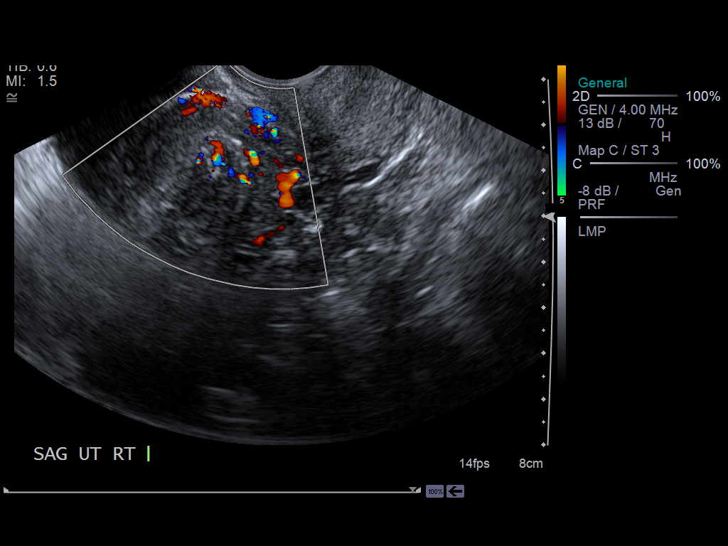
[im 96/144]
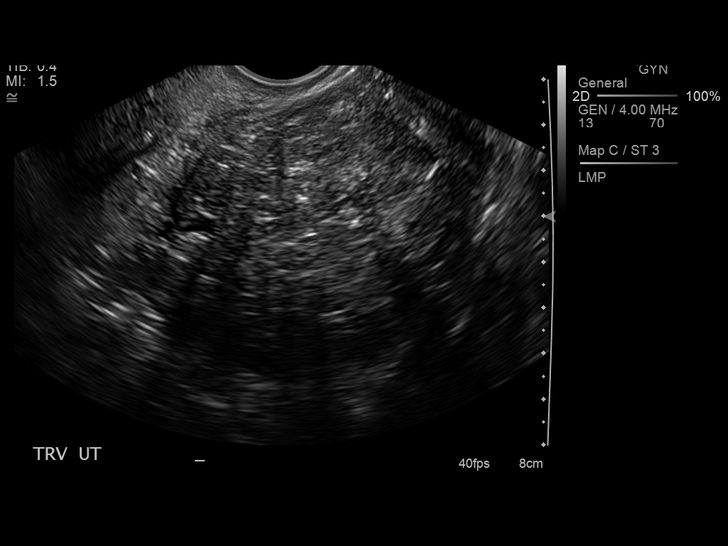
[im 108/144]
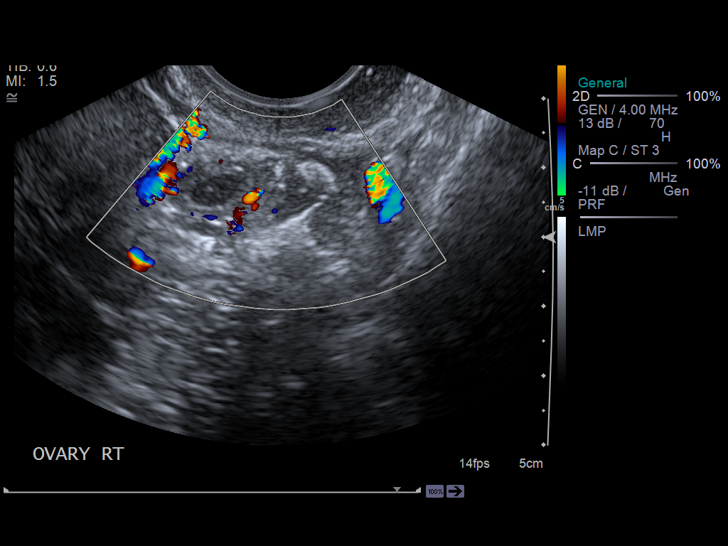
[im 120/144]
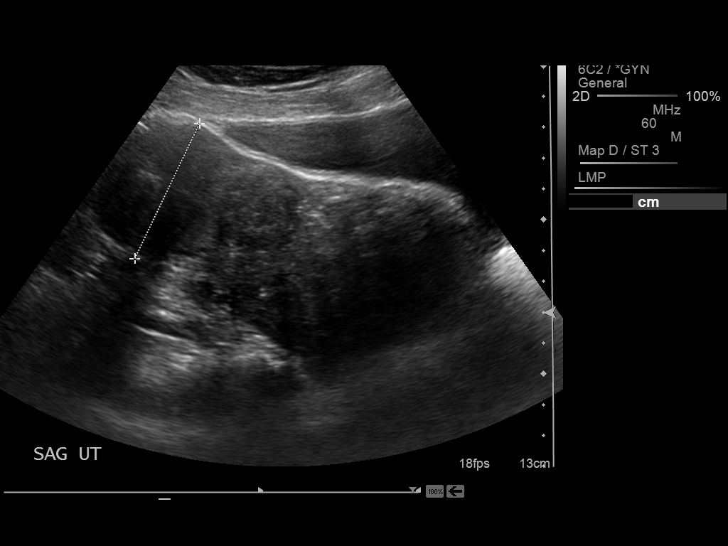
[im 132/144]
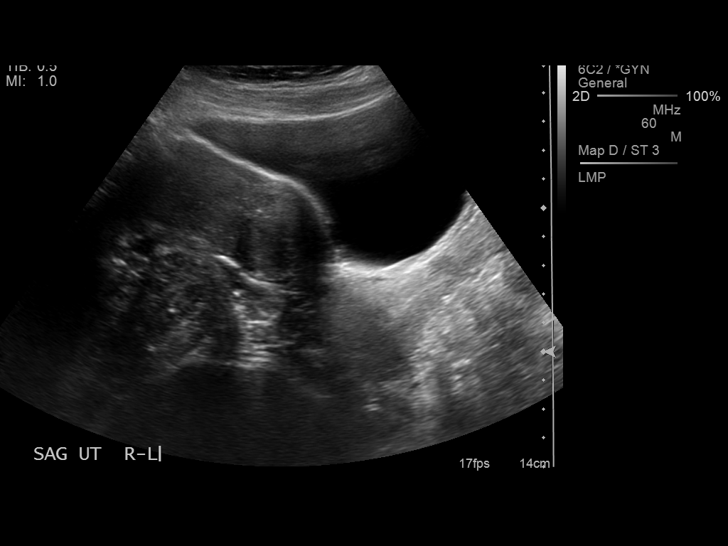
[im 144/144]
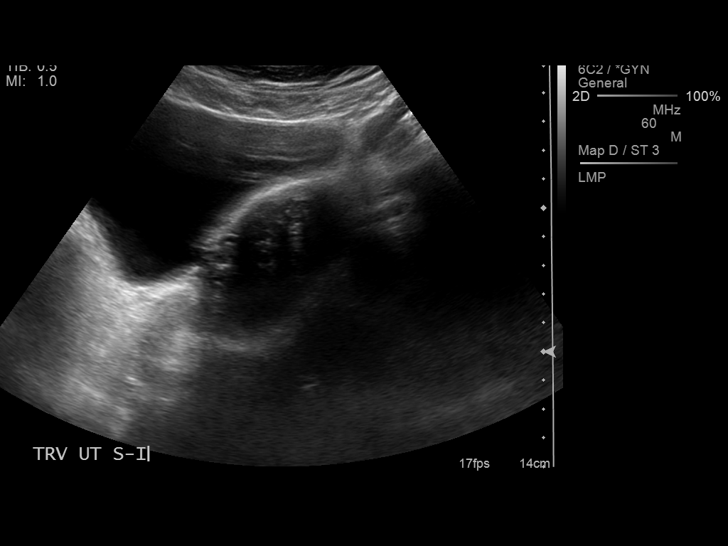

[13 of 25 positions shown; findings below may reference images not displayed]

FINDINGS: Uterus: Uterus is oriented in a oblique plane in the midline with
slight retroversion of the fundus compared to the body.  The uterus
is enlarged.  The uterus measures 15.6 x 5.1 x 9.9 cm.  Multiple
intramural solid masses are seen consistentwith multiple uterine
leiomyomas.  These appear intramural.  The largest measured 8.5 x
7.0 x 7.6 cm.  The next largest measured 3.1 x 3.1 x 2.7 cm.

Endometrium: Endometrium was partially obscured by the uterine
leiomyomas. Anchored endometrial thickness could not be measured
but it does not appear thickened. No endometrial mass or fluid
collection was evident.

Right ovary:  Right ovary measures 3.3 x 1.9 x 2.2 cm.  A
hypoechoic area measured 0.9 x 0.8  x 0.5 cm. This structure has
smooth margins with no internal color Doppler flow.  There are
internal echoes.

Left ovary: Left ovary measured 3.1 x 1.6 x 3.4 cm. No left ovarian
or left adnexal lesion is demonstrated.

Other findings: No free fluid is seen in the pelvis.
IMPRESSION: Enlarged uterus with multiple masses consistent with multiple
uterine leiomyomas.

Endometrial  detail was obscured by the uterine leiomyomas but no
thickening or abnormal fluid collection or mass was evident.

Left ovary appeared normal.

Right ovary is normal size.

There is a small hypoechoic area with internal echoes in the right
ovary.    The hypoechoic area had a greatest diameter 0.9 cm. There
is no internal color Doppler flow in this area.No increased
peripheral flow by color Doppler examination is seen.  No definite
posterior acoustic enhancement is seen.  This could reflect a small
solid nodular area or complicated cyst. There was a cyst in this
area on the prior study. The area within the right ovary is quite
small.  Follow up examination in 6-12 weeks could be performed for
additional assessment..

## 2013-11-01 ENCOUNTER — Ambulatory Visit: Payer: 59 | Admitting: Family Medicine

## 2013-11-02 ENCOUNTER — Encounter: Payer: Self-pay | Admitting: Family Medicine

## 2013-11-02 ENCOUNTER — Ambulatory Visit (INDEPENDENT_AMBULATORY_CARE_PROVIDER_SITE_OTHER): Payer: 59 | Admitting: Family Medicine

## 2013-11-02 VITALS — BP 126/82 | Ht 64.0 in | Wt 153.4 lb

## 2013-11-02 DIAGNOSIS — M545 Low back pain, unspecified: Secondary | ICD-10-CM

## 2013-11-02 DIAGNOSIS — R5381 Other malaise: Secondary | ICD-10-CM | POA: Insufficient documentation

## 2013-11-02 DIAGNOSIS — R5383 Other fatigue: Principal | ICD-10-CM

## 2013-11-02 DIAGNOSIS — D649 Anemia, unspecified: Secondary | ICD-10-CM | POA: Insufficient documentation

## 2013-11-02 DIAGNOSIS — G8929 Other chronic pain: Secondary | ICD-10-CM

## 2013-11-02 DIAGNOSIS — M549 Dorsalgia, unspecified: Secondary | ICD-10-CM

## 2013-11-02 LAB — POCT HEMOGLOBIN: Hemoglobin: 12.1 g/dL — AB (ref 12.2–16.2)

## 2013-11-02 MED ORDER — ETODOLAC 400 MG PO TABS: 400.0000 mg | ORAL_TABLET | Freq: Two times a day (BID) | ORAL | Status: DC

## 2013-11-02 MED ORDER — CHLORZOXAZONE 500 MG PO TABS: 500.0000 mg | ORAL_TABLET | Freq: Three times a day (TID) | ORAL | Status: DC | PRN

## 2013-11-02 NOTE — Progress Notes (Signed)
   Subjective:    Patient ID: Terri Rivera, female    DOB: [birth year], 53 y.o.   MRN: [id number]  HPI Patient is here today because she has been experiencing fatigue for several months now. No other symptoms at this time. Had fibroids removed back in 2013 and has been dealing with low hemoglobin since the surgery. Today's hemoglobin is 12.1.   Still having protracted fatigue . Ongoing challenges with stress. Recently tick on the medial child that she is watching at her home.  Having hot flashes. Wonders about my opinion regarding estrogen supplement.   Feeling tired sometimes. Positive low back pain  Had to remove the ovaries also with the fibroids.  On valium from psychiatrist once or twice per day.  Not exercising regularly.  Pain is deep in the low back region, has hurt more since surgery. Minimal radiation of the pain. Not exercising regularly.   Review of Systems No chest pain no upper back pain no abdominal pain no change in bowel habits no blood in stool no rash ROS otherwise negative    Objective:   Physical Exam Alert no apparent distress. H&T normal. Lungs clear. Heart rare rhythm. Low back perhaps tender to percussion negative straight leg raise.       Assessment & Plan:  Impression chronic back pain discuss likely muscular skeletal. #2 fatigue ongoing discuss worse recently. #3 anemia possibly iron deficiency with heavy periods up until last year. #4 increased stress discussed #5 hot flashes unfortunately not a good candidate for estrogen. Rationale discussed. Patient will likely hold off on further. Plan patient wishes to continue to come here for preventative checkup with care 1. Appropriate blood work. Appropriate medication for back. Diet exercise discussed. Low back exercises discussed. Further recommendations based on blood work results. WSL

## 2013-11-04 LAB — CBC WITH DIFFERENTIAL/PLATELET
BASOS ABS: 0 10*3/uL (ref 0.0–0.1)
Basophils Relative: 0 % (ref 0–1)
EOS PCT: 3 % (ref 0–5)
Eosinophils Absolute: 0.2 10*3/uL (ref 0.0–0.7)
HCT: 38.4 % (ref 36.0–46.0)
Hemoglobin: 13.1 g/dL (ref 12.0–15.0)
LYMPHS ABS: 2.4 10*3/uL (ref 0.7–4.0)
LYMPHS PCT: 45 % (ref 12–46)
MCH: 27.2 pg (ref 26.0–34.0)
MCHC: 34.1 g/dL (ref 30.0–36.0)
MCV: 79.7 fL (ref 78.0–100.0)
Monocytes Absolute: 0.5 10*3/uL (ref 0.1–1.0)
Monocytes Relative: 10 % (ref 3–12)
NEUTROS ABS: 2.1 10*3/uL (ref 1.7–7.7)
Neutrophils Relative %: 42 % — ABNORMAL LOW (ref 43–77)
PLATELETS: 425 10*3/uL — AB (ref 150–400)
RBC: 4.82 MIL/uL (ref 3.87–5.11)
RDW: 14.1 % (ref 11.5–15.5)
WBC: 5.3 10*3/uL (ref 4.0–10.5)

## 2013-11-04 LAB — BASIC METABOLIC PANEL
BUN: 12 mg/dL (ref 6–23)
CHLORIDE: 102 meq/L (ref 96–112)
CO2: 29 mEq/L (ref 19–32)
Calcium: 9.7 mg/dL (ref 8.4–10.5)
Creat: 1.08 mg/dL (ref 0.50–1.10)
Glucose, Bld: 74 mg/dL (ref 70–99)
POTASSIUM: 4.9 meq/L (ref 3.5–5.3)
SODIUM: 140 meq/L (ref 135–145)

## 2013-11-04 LAB — FERRITIN: FERRITIN: 67 ng/mL (ref 10–291)

## 2013-11-04 LAB — HEPATIC FUNCTION PANEL
ALK PHOS: 58 U/L (ref 39–117)
ALT: 13 U/L (ref 0–35)
AST: 21 U/L (ref 0–37)
Albumin: 4.2 g/dL (ref 3.5–5.2)
BILIRUBIN DIRECT: 0.1 mg/dL (ref 0.0–0.3)
BILIRUBIN INDIRECT: 0.4 mg/dL (ref 0.2–1.2)
BILIRUBIN TOTAL: 0.5 mg/dL (ref 0.2–1.2)
Total Protein: 7.9 g/dL (ref 6.0–8.3)

## 2013-11-04 LAB — TSH: TSH: 1.857 u[IU]/mL (ref 0.350–4.500)

## 2013-11-07 ENCOUNTER — Encounter: Payer: Self-pay | Admitting: Family Medicine

## 2014-05-20 ENCOUNTER — Encounter: Payer: Self-pay | Admitting: Family Medicine

## 2014-05-20 ENCOUNTER — Ambulatory Visit (INDEPENDENT_AMBULATORY_CARE_PROVIDER_SITE_OTHER): Payer: 59 | Admitting: Family Medicine

## 2014-05-20 VITALS — BP 128/88 | Ht 64.0 in | Wt 159.0 lb

## 2014-05-20 DIAGNOSIS — R079 Chest pain, unspecified: Secondary | ICD-10-CM

## 2014-05-20 MED ORDER — HYDROCODONE-ACETAMINOPHEN 5-325 MG PO TABS: ORAL_TABLET | ORAL | Status: DC

## 2014-05-20 MED ORDER — NABUMETONE 500 MG PO TABS: 500.0000 mg | ORAL_TABLET | Freq: Two times a day (BID) | ORAL | Status: DC

## 2014-05-20 NOTE — Progress Notes (Signed)
   Subjective:    Patient ID: Terri Rivera, female    DOB: 1961/02/24, 53 y.o.   MRN: [id number]  HPIFollow up from fall. Went to [place] urgent care on 05/16/14.  Diagnosed with closed fracture of rib, right wrist sprain, right ankle sprain. Prescribed percocet but pt states she was not able to take because of itching. Taking lidoderm 5% patch q 12 hours. And nabumetone 500mg  BID.  Still having considerable pain, unable to go on flight on Thursday  Pain is still sharp . Right lateral chest. Worse with a deep breath. Chest x-ray revealed 1 fractured rib. No cough no fever no congestion.  Still diffiult at times  Oxycodone causes itching  advil didn't help, now on lodine and  Can take hydrocod   Very sig sharp pain worse with a deep breath  Notes wrist pain wearing wrist splint worse with dorsiflexion. X-rays were negative.  Ankle pain. Negative x-rays. Some slight limp but improved.  Review of Systems No headache no chest pain no loss of consciousness no abdominal pain no change in bowel habits    Objective:   Physical Exam Alert slight malaise no acute distress. Vitals stable. HEENT normal neck supple. Lungs clear heart regular in rhythm right lateral chest wall very tender to palpation. Abdominal exam benign. Wrist good range of motion slight to moderate tenderness on dorsiflexion. Ankle good no particular edema some pain with full range of motion       Assessment & Plan:  Impression 1 rib fracture discussed at length. #2 wrist sprain. Use hospital brace for next 10 days and try to wean off. #3 ankle sprain use hospital brace for next week and try to wean off. Plan anti-inflammatory medicine prescribed. Hydrocodone prescribed. Work excuse written. Patient works as a Catering manager and in no way that she can do her current tasks with an acute rib fracture and other injuries. 25 minutes spent most in discussion. WSL

## 2014-05-30 ENCOUNTER — Telehealth: Payer: Self-pay | Admitting: Family Medicine

## 2014-05-30 ENCOUNTER — Ambulatory Visit: Payer: 59 | Admitting: Family Medicine

## 2014-05-30 MED ORDER — HYDROCODONE-ACETAMINOPHEN 5-325 MG PO TABS: ORAL_TABLET | ORAL | Status: DC

## 2014-05-30 NOTE — Telephone Encounter (Signed)
Patient got hydrocodone #24 on 05/20/14

## 2014-05-30 NOTE — Telephone Encounter (Signed)
Patient said that she is still experiencing rib pain and wants to know if Dr. Richardson Terri Rivera can write Rx for a few more hydrocodone? Also, she forgot to fill out records release for x-ray, but will mail CD or if need be bring by the office.

## 2014-05-30 NOTE — Telephone Encounter (Signed)
Ref same rx amnt again, no need for imaging at this time

## 2014-05-31 NOTE — Telephone Encounter (Signed)
Rx up front for patient pick up. Patient notified. 

## 2014-07-19 ENCOUNTER — Encounter: Payer: Self-pay | Admitting: Family Medicine

## 2014-07-19 ENCOUNTER — Telehealth: Payer: Self-pay | Admitting: *Deleted

## 2014-07-19 ENCOUNTER — Ambulatory Visit (INDEPENDENT_AMBULATORY_CARE_PROVIDER_SITE_OTHER): Payer: 59 | Admitting: Family Medicine

## 2014-07-19 VITALS — BP 118/84 | Temp 98.1°F | Ht 64.0 in | Wt 159.0 lb

## 2014-07-19 DIAGNOSIS — J310 Chronic rhinitis: Secondary | ICD-10-CM

## 2014-07-19 DIAGNOSIS — J329 Chronic sinusitis, unspecified: Secondary | ICD-10-CM

## 2014-07-19 MED ORDER — AMOXICILLIN-POT CLAVULANATE 875-125 MG PO TABS: 1.0000 | ORAL_TABLET | Freq: Two times a day (BID) | ORAL | Status: DC

## 2014-07-19 MED ORDER — HYDROCODONE-ACETAMINOPHEN 5-325 MG PO TABS: 1.0000 | ORAL_TABLET | Freq: Every day | ORAL | Status: DC | PRN

## 2014-07-19 MED ORDER — NABUMETONE 500 MG PO TABS: 500.0000 mg | ORAL_TABLET | Freq: Two times a day (BID) | ORAL | Status: DC

## 2014-07-19 NOTE — Progress Notes (Signed)
   Subjective:    Patient ID: Terri Rivera, female    DOB: [birth year], 53 y.o.   MRN: [id number]  Sinusitis This is a recurrent problem. The current episode started more than 1 month ago. Associated symptoms include ear pain, headaches and sinus pressure.  Was seen at urgent care on 9/18. Prescribed a zpack.   Sinuses tend to act up this time of yr  On sept pt got sick and took a zpk, didn't help  Pain in both ears significant  Head still causing pain and discomfort    Had bilat effusion  Requesting refill on hydrocodone for ankle pain. Unfortunately ankle still acting up at times. No overall improved. See prior note  Review of Systems  HENT: Positive for ear pain and sinus pressure.   Neurological: Positive for headaches.       Objective:   Physical Exam  Alert hydration good vitals stable. Frontal maxillary tenderness pharynx normal neck supple. Lungs clear. Heart regular rate and rhythm.      Assessment & Plan:  Impression rhinosinusitis plan Augmentin twice a day 10 days. He had hydrocodone when necessary for pain warning signs discussed. Soon patient should no longer need this for this injury. WS

## 2014-07-19 NOTE — Telephone Encounter (Signed)
Pt dropped off disc of her ankle xray.

## 2014-08-01 ENCOUNTER — Encounter: Payer: Self-pay | Admitting: Family Medicine

## 2014-08-05 DIAGNOSIS — Z0289 Encounter for other administrative examinations: Secondary | ICD-10-CM

## 2015-04-18 ENCOUNTER — Telehealth: Payer: Self-pay | Admitting: Family Medicine

## 2015-04-18 NOTE — Telephone Encounter (Signed)
Pt called about husbands appt with radiology. Pt wanted to know if we could reschedule the appt that her husband has court tomorrow. I asked the pt who made the radiology appt. And she said the Dr. At the hospital. I explained to the pt that the ordering provider would have to be the one to reschedule it. Pt then wanted to make an appt for her husband tomorrow with Dr. Richardson Landry. I explained to the pt that Dr. Richardson Landry was out of the office this week. Pt then stated that she would just call back and hung up. Pt then called back a few minutes later and was very rude and hateful with me. She kept telling me that I didn't belong in this field and that I was very unapproachable that I let her go through the whole spill of her husband's situation without stopping her and telling her that it was up to the other Dr that had to redue the appt. I felt as though it would have been rude of me to stop her so I let her finish talking before I interrupted her. She then stated that I have always been unapproachable even when her mother came here (even though I was not here then). I told her I was sorry that she felt that way that it was not my intention to make her feel that way. She then said she would be talking to Dr. Richardson Landry about my behavior. I just wanted to give a heads up and let you know what happened.

## 2015-04-23 NOTE — Telephone Encounter (Signed)
Plz route back to Terri Rivera, That was inappropriate of her to address you like that, thanks for heads up, not rude enough to warrant dismissal but inappropriate

## 2015-06-08 ENCOUNTER — Other Ambulatory Visit: Payer: Self-pay | Admitting: Family Medicine

## 2015-06-09 ENCOUNTER — Encounter: Payer: Self-pay | Admitting: Family Medicine

## 2015-06-09 ENCOUNTER — Ambulatory Visit (INDEPENDENT_AMBULATORY_CARE_PROVIDER_SITE_OTHER): Payer: 59 | Admitting: Family Medicine

## 2015-06-09 VITALS — BP 124/80 | Temp 97.9°F | Ht 64.0 in | Wt 170.1 lb

## 2015-06-09 DIAGNOSIS — J329 Chronic sinusitis, unspecified: Secondary | ICD-10-CM

## 2015-06-09 MED ORDER — AMOXICILLIN-POT CLAVULANATE 875-125 MG PO TABS: 1.0000 | ORAL_TABLET | Freq: Two times a day (BID) | ORAL | Status: AC

## 2015-06-09 MED ORDER — DICLOFENAC SODIUM 75 MG PO TBEC: 75.0000 mg | DELAYED_RELEASE_TABLET | Freq: Two times a day (BID) | ORAL | Status: AC

## 2015-06-09 NOTE — Progress Notes (Signed)
   Subjective:    Patient ID: Terri Rivera, female    DOB: 25-Nov-1960, 54 y.o.   MRN: [id number]  Sinusitis This is a new problem. The current episode started in the past 7 days. The problem is unchanged. There has been no fever. The pain is moderate. Associated symptoms include congestion and sinus pressure. Past treatments include spray decongestants. The treatment provided no relief.   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 She would like something prescribed for this.   Uses claritin prn, not cutting it completely  Finger right middle finger acting up woith hx of arthritis, bad pain, off an for months , and to straighten out fingdr right handed     Review of Systems  HENT: Positive for congestion and sinus pressure.    no vomiting no diarrhea no high fever     Objective:   Physical Exam  Alert vitals stable HEENT moderate nasal congestion frontal tenderness middle finger right hand trigger finger noted. No arthritis changes of finger      Assessment & Plan:  Impression 1 rhinosinusitis #2 trigger finger discussed plan antibiotics prescribed. Symptom care discussed warning signs discussed WSL

## 2015-06-21 ENCOUNTER — Encounter: Payer: Self-pay | Admitting: Family Medicine

## 2015-06-21 ENCOUNTER — Telehealth: Payer: Self-pay | Admitting: Family Medicine

## 2015-06-21 MED ORDER — FLUTICASONE PROPIONATE 50 MCG/ACT NA SUSP: 2.0000 | Freq: Every day | NASAL | Status: DC

## 2015-06-21 NOTE — Telephone Encounter (Signed)
Pt called wanting an excuse for work for the next three days because of her sinuses. Pt also would like for a nasal spray to be called in. Pt was seen last week and was given an antibiotic but is not better. Pt would like to speak with a nurse as well.   Fax note to [phone] attn [facility]

## 2015-06-21 NOTE — Telephone Encounter (Signed)
flonase two sp ea nost qd, also w e is ok

## 2015-06-21 NOTE — Telephone Encounter (Signed)
Called and spoke with patient and patient has c/o nasal congestion and hoarse voice and would like to possibly get a nasal spray. 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 She stated that she had to call out today for a 3 day trips and just wants a note to cover this one instance. Please advise?

## 2015-06-21 NOTE — Telephone Encounter (Signed)
Note done, faxed as requested

## 2015-06-21 NOTE — Telephone Encounter (Signed)
Called patient and informed her per Dr.Steve Luking- Flonase was sent into pharmacy and that he approved Doctors noted for three days. Routing message to administration pool for work excuse. Patient wants work excuse faxed to her job please see previous note for fax number.

## 2015-07-18 ENCOUNTER — Other Ambulatory Visit: Payer: Self-pay | Admitting: Family Medicine

## 2015-08-08 ENCOUNTER — Telehealth: Payer: Self-pay | Admitting: Family Medicine

## 2015-08-08 NOTE — Telephone Encounter (Signed)
Pt called stating that her right middle trigger finger is now causing more pain Does she need to be seen here or refer to ortho (if needs referral, patient states she will set up if we can give her some names's & #'s)

## 2015-08-08 NOTE — Telephone Encounter (Signed)
Lets give pt a couple of ortho name and numbers

## 2015-08-10 NOTE — Telephone Encounter (Signed)
Called & gave pt # to Dr. Levell July [facility] [place] [phone]

## 2015-10-05 ENCOUNTER — Telehealth: Payer: Self-pay | Admitting: Family Medicine

## 2015-10-05 MED ORDER — AMOXICILLIN-POT CLAVULANATE 875-125 MG PO TABS: 1.0000 | ORAL_TABLET | Freq: Two times a day (BID) | ORAL | Status: AC

## 2015-10-05 NOTE — Telephone Encounter (Signed)
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 Pt is wanting an antibiotic called in.    [facility]

## 2015-10-05 NOTE — Telephone Encounter (Signed)
Last seen 06/09/15 for sinusitis

## 2015-10-05 NOTE — Telephone Encounter (Signed)
Left message notifying patient medication was sent to pharmacy.

## 2015-10-05 NOTE — Telephone Encounter (Signed)
Aug 875 bid ten d 

## 2015-10-27 ENCOUNTER — Encounter: Payer: Self-pay | Admitting: Family Medicine

## 2015-10-27 ENCOUNTER — Ambulatory Visit (INDEPENDENT_AMBULATORY_CARE_PROVIDER_SITE_OTHER): Payer: 59 | Admitting: Family Medicine

## 2015-10-27 VITALS — BP 144/90 | Temp 98.2°F | Ht 64.0 in | Wt 172.0 lb

## 2015-10-27 DIAGNOSIS — J329 Chronic sinusitis, unspecified: Secondary | ICD-10-CM

## 2015-10-27 MED ORDER — CEFPROZIL 500 MG PO TABS: 500.0000 mg | ORAL_TABLET | Freq: Two times a day (BID) | ORAL | Status: AC

## 2015-10-27 NOTE — Progress Notes (Signed)
   Subjective:    Patient ID: Terri Rivera, female    DOB: Jan 16, 1961, 55 y.o.   MRN: [id number]  Sinusitis This is a new problem. Associated symptoms include congestion, ear pain and sinus pressure. Past treatments include spray decongestants (Flonase, Amoxcillin).   Woke up sith sinus headache   Throat discomfort  Ears not feeling good  Patient states no other concerns this visit.  See prior telephone message. Unable to handle the Augmentin with GI side effects  Review of Systems  HENT: Positive for congestion, ear pain and sinus pressure.    Low-grade fever diminished energy    Objective:   Physical Exam  Alert, mild malaise. Hydration good Vitals stable. frontal/ maxillary tenderness evident positive nasal congestion. pharynx normal neck supple  lungs clear/no crackles or wheezes. heart regular in rhythm       Assessment & Plan:  Impression rhinosinusitis likely post viral, discussed with patient. plan antibiotics prescribed. Questions answered. Symptomatic care discussed. warning signs discussed. WSL

## 2015-10-30 ENCOUNTER — Encounter: Payer: Self-pay | Admitting: Family Medicine

## 2017-03-24 ENCOUNTER — Telehealth: Payer: Self-pay | Admitting: Family Medicine

## 2017-03-24 ENCOUNTER — Ambulatory Visit: Payer: 59 | Admitting: Family Medicine

## 2017-03-24 NOTE — Telephone Encounter (Signed)
Patient called and stated that she was not going to be able to make her appointment today at 3:00pm at 2:40 pm. Patient stated that she was canceling her appointment at the last minute because Dr.Steve does not like her to bring her daughter to her appointments. Patient then stated that she had spoke with the front staff and told that canceling her appointment would result into a no show. Patient then told me that she know one of the front staff members and she will be handling her personally. She then told me that she would still come and be at her appointment by 3:05 pm. While on the phone talking with the patient Dr.Steve Luking was standing beside me and told me to put the patient on hold. I placed the patient on hold and was told to tell the patient that if she canceled the appointment that it would be a no show and per our policy she would have to come in next week for an appointment. I picked the phone back up and relayed the message. Patient then stated she would be here again at 3:05 pm and when she got here she would let all of [place] staff "have it" because we are all "sloppy" and she does not need to get a no show for appointment that was made today.

## 2017-03-26 ENCOUNTER — Ambulatory Visit: Payer: 59 | Admitting: Family Medicine

## 2022-01-22 ENCOUNTER — Encounter (INDEPENDENT_AMBULATORY_CARE_PROVIDER_SITE_OTHER): Payer: Self-pay | Admitting: *Deleted

## 2022-07-02 ENCOUNTER — Encounter (INDEPENDENT_AMBULATORY_CARE_PROVIDER_SITE_OTHER): Payer: Self-pay | Admitting: *Deleted

## 2023-03-13 LAB — EXTERNAL GENERIC LAB PROCEDURE: COLOGUARD: NEGATIVE
